# Patient Record
Sex: Female | Born: 1950
Health system: Southern US, Community
[De-identification: ages and names within clinical notes are randomized; demographics above are authoritative.]

## PROBLEM LIST (undated history)

## (undated) DIAGNOSIS — Q625 Duplication of ureter: Secondary | ICD-10-CM

## (undated) DIAGNOSIS — D126 Benign neoplasm of colon, unspecified: Secondary | ICD-10-CM

## (undated) DIAGNOSIS — C539 Malignant neoplasm of cervix uteri, unspecified: Secondary | ICD-10-CM

## (undated) DIAGNOSIS — R112 Nausea with vomiting, unspecified: Secondary | ICD-10-CM

## (undated) DIAGNOSIS — E785 Hyperlipidemia, unspecified: Secondary | ICD-10-CM

## (undated) DIAGNOSIS — N809 Endometriosis, unspecified: Secondary | ICD-10-CM

## (undated) DIAGNOSIS — M8580 Other specified disorders of bone density and structure, unspecified site: Secondary | ICD-10-CM

## (undated) DIAGNOSIS — Z9889 Other specified postprocedural states: Secondary | ICD-10-CM

## (undated) DIAGNOSIS — K7689 Other specified diseases of liver: Secondary | ICD-10-CM

## (undated) DIAGNOSIS — R32 Unspecified urinary incontinence: Secondary | ICD-10-CM

## (undated) DIAGNOSIS — J45909 Unspecified asthma, uncomplicated: Secondary | ICD-10-CM

## (undated) DIAGNOSIS — D3132 Benign neoplasm of left choroid: Secondary | ICD-10-CM

## (undated) HISTORY — DX: Unspecified urinary incontinence: R32

## (undated) HISTORY — DX: Endometriosis, unspecified: N80.9

## (undated) HISTORY — DX: Unspecified asthma, uncomplicated: J45.909

## (undated) HISTORY — DX: Duplication of ureter: Q62.5

## (undated) HISTORY — PX: POLYPECTOMY: SHX149

## (undated) HISTORY — DX: Malignant neoplasm of cervix uteri, unspecified: C53.9

## (undated) HISTORY — DX: Other specified diseases of liver: K76.89

## (undated) HISTORY — DX: Benign neoplasm of left choroid: D31.32

## (undated) HISTORY — DX: Other specified disorders of bone density and structure, unspecified site: M85.80

## (undated) HISTORY — DX: Hyperlipidemia, unspecified: E78.5

## (undated) HISTORY — DX: Nausea with vomiting, unspecified: R11.2

## (undated) HISTORY — DX: Benign neoplasm of colon, unspecified: D12.6

## (undated) HISTORY — DX: Other specified postprocedural states: Z98.890

## (undated) HISTORY — PX: OTHER SURGICAL HISTORY: SHX169

---

## 1999-03-26 ENCOUNTER — Encounter (INDEPENDENT_AMBULATORY_CARE_PROVIDER_SITE_OTHER): Payer: Self-pay | Admitting: Specialist

## 1999-03-26 ENCOUNTER — Ambulatory Visit (HOSPITAL_COMMUNITY): Admission: RE | Admit: 1999-03-26 | Discharge: 1999-03-26 | Payer: Self-pay | Admitting: Gynecology

## 1999-07-12 ENCOUNTER — Encounter: Payer: Self-pay | Admitting: Family Medicine

## 1999-07-12 ENCOUNTER — Encounter: Admission: RE | Admit: 1999-07-12 | Discharge: 1999-07-12 | Payer: Self-pay | Admitting: Family Medicine

## 2000-08-03 ENCOUNTER — Encounter: Payer: Self-pay | Admitting: Family Medicine

## 2000-08-03 ENCOUNTER — Encounter: Admission: RE | Admit: 2000-08-03 | Discharge: 2000-08-03 | Payer: Self-pay | Admitting: Family Medicine

## 2001-08-04 ENCOUNTER — Encounter: Payer: Self-pay | Admitting: Family Medicine

## 2001-08-04 ENCOUNTER — Encounter: Admission: RE | Admit: 2001-08-04 | Discharge: 2001-08-04 | Payer: Self-pay | Admitting: Family Medicine

## 2002-08-08 ENCOUNTER — Other Ambulatory Visit: Admission: RE | Admit: 2002-08-08 | Discharge: 2002-08-08 | Payer: Self-pay | Admitting: Obstetrics and Gynecology

## 2003-08-10 ENCOUNTER — Other Ambulatory Visit: Admission: RE | Admit: 2003-08-10 | Discharge: 2003-08-10 | Payer: Self-pay | Admitting: Obstetrics and Gynecology

## 2004-08-20 ENCOUNTER — Other Ambulatory Visit: Admission: RE | Admit: 2004-08-20 | Discharge: 2004-08-20 | Payer: Self-pay | Admitting: Family Medicine

## 2004-08-21 ENCOUNTER — Encounter: Admission: RE | Admit: 2004-08-21 | Discharge: 2004-08-21 | Payer: Self-pay | Admitting: Family Medicine

## 2005-08-25 ENCOUNTER — Encounter: Admission: RE | Admit: 2005-08-25 | Discharge: 2005-08-25 | Payer: Self-pay | Admitting: Obstetrics and Gynecology

## 2006-04-14 HISTORY — PX: OTHER SURGICAL HISTORY: SHX169

## 2006-07-27 ENCOUNTER — Encounter: Admission: RE | Admit: 2006-07-27 | Discharge: 2006-08-28 | Payer: Self-pay | Admitting: Nurse Practitioner

## 2006-08-27 ENCOUNTER — Encounter: Admission: RE | Admit: 2006-08-27 | Discharge: 2006-08-27 | Payer: Self-pay | Admitting: Obstetrics and Gynecology

## 2006-12-22 ENCOUNTER — Ambulatory Visit (HOSPITAL_COMMUNITY): Admission: RE | Admit: 2006-12-22 | Discharge: 2006-12-23 | Payer: Self-pay | Admitting: Obstetrics and Gynecology

## 2010-04-14 DIAGNOSIS — Z9889 Other specified postprocedural states: Secondary | ICD-10-CM

## 2010-04-14 HISTORY — DX: Other specified postprocedural states: Z98.890

## 2010-08-27 NOTE — H&P (Signed)
NAMEMEGIN, CONSALVO              ACCOUNT NO.:  [id number]   MEDICAL RECORD NO.:  [id number]          PATIENT TYPE:  AMB   LOCATION:  SDC                           FACILITY:  [facility]   PHYSICIAN:  Randye Lobo, M.D.   DATE OF BIRTH:  [birth year]   DATE OF ADMISSION:  12/22/2006  DATE OF DISCHARGE:                              HISTORY & PHYSICAL   DATE OF PREOPERATIVE HISTORY AND PHYSICAL EXAMINATION:  December 11, 2006.   CHIEF COMPLAINT:  Urinary incontinence.   HISTORY OF PRESENT ILLNESS:  The patient is a 60 year old, para 2,  Caucasian female with a history of urinary incontinence who first  presented for evaluation in May 2007.  The patient had previously been  seen by a urologist in [place] and did not pursue any surgical  treatment.  She has previously had urodynamic study performed in 2001.  The patient does report urinary leakage with stressful maneuvers such as  coughing, sneezing, and standing.  She also reports urgency-related  leakage.  The patient has been treated in the past with Detrol LA which  caused some blurred vision and diarrhea and was, therefore,  discontinued.  She has been also treated with Philis Nettle and is currently  not on any anticholinergic therapy.  [facility] has not improved her symptoms of urinary  incontinence, and the patient is interested in proceeding with surgical  treatment.   Multichannel urodynamic testing was performed in the office in August  2007.  The uroflow study demonstrated a continuous flow of urine with a  postvoid residual of 12 mL.  Cystometrics demonstrated genuine stress  incontinence with a Valsalva lead point pressure of 45 cm of water at  222 mL of urine which was at maximum capacity.  The patient was noted to  have early first desire at 55 mL.  The patient's cystometric study  demonstrated no evidence of detrusor instability.  Pressure flow study  showed a maximum  detrusor pressure of 35 cm of water.   The patient's past obstetric and gynecologic histories are remarkable  for two prior vaginal deliveries.  Each child weighed over 8 pounds.  The patient's last menstrual period was in 2004, and she is not on any  hormone replacement therapy.  Her last Pap smear was performed in May  2008 and was within normal limits.  Her last mammogram was performed in  May 2008 and was also within normal limits.  The patient is status post  D&C for a benign endometrial polyp in 2001.   PAST MEDICAL HISTORY:  1. Hyperlipidemia which is currently untreated.  On Sep 04, 2006, the      patient had a total cholesterol of 288 with LDL cholesterol of 185.      A fasting glucose was noted to be 94.  2. The patient has had cardiac palpitations which have been evaluated      by her cardiologist.  The patient underwent an echocardiogram in      June 2008, and I do not have the results of this.  3.  Recurrent urinary tract infections and hematuria.  The patient was      seen by [facility] by Dr. Bjorn Pippin.  An abdominal      ultrasound performed on November 21, 2005, documented normal kidneys      with no evidence of hydronephrosis, nephrolithiasis, or renal mass.      Of note, the patient was found to have liver cysts of the right      lobe of the liver which measured 3.62 cm, 3.1 cm, and the largest      measuring 13.05 cm.  Consultation with a gastroenterologist by Dr.      Annabell Howells gave the opinion that these were benign in nature and needed      no further workup.   PAST SURGICAL HISTORY:  Status post D&C in 2001.   MEDICATIONS:  Flonase, Claritin p.r.n., multivitamins, fish oil,  calcium.   ALLERGIES:  DETROL produces blurred vision and diarrhea.   SOCIAL HISTORY:  The patient is married.  She is a Solicitor.  She  denies the use of tobacco.  She drinks 1-2 alcohol beverages per day.  She denies the use of illicit drugs.   FAMILY HISTORY:  Positive for  hypertension in the patient's mother and  positive for stroke in the patient's maternal grandmother.  Her family  medical history is negative for breast, ovarian, uterine, or colon  cancer.   REVIEW OF SYSTEMS:  Please refer to the History of Present Illness.   PHYSICAL EXAMINATION:  VITAL SIGNS:  Height 5 feet 2 inches, weight 142  pounds.  Blood pressure 117/74.  GENERAL:  The patient is a middle-age-appearing Caucasian female in no  acute distress.  HEENT:  Normocephalic and atraumatic.  HEART:  S1, S2 with a regular rate and rhythm.  There is no evidence of  a murmur, rub, or gallop.  There is no evidence of any lower extremity  edema.  LUNGS:  Clear to auscultation bilaterally.  ABDOMEN:  Soft and nontender and without evidence of hepatosplenomegaly  or organomegaly.  PELVIC:  Normal external genitalia and urethra.  There is a minimal  cystocele appreciated.  The cervix is noted to be without lesions.  The  uterus is small and nontender.  There is no evidence of adnexal masses  nor tenderness.   IMPRESSION:  The patient is a 60 year old, para 2 female with  urodynamically-proven genuine stress incontinence.   PLAN:  The patient will undergo a tension-free vaginal tape suburethral  sling and cystoscopy at the [facility] on December 22, 2006.  Risks, benefits, and alternatives have been discussed with the  patient who wishes to proceed.      Randye Lobo, M.D.  Electronically Signed     BES/MEDQ  D:  12/21/2006  T:  12/21/2006  Job:  [id number]

## 2010-08-27 NOTE — Op Note (Signed)
Amber Escobar, CHEEK              ACCOUNT NO.:  [id number]   MEDICAL RECORD NO.:  [id number]          PATIENT TYPE:  INP   LOCATION:  9311                          FACILITY:  [facility]   PHYSICIAN:  Randye Lobo, M.D.   DATE OF BIRTH:  25-Dec-1950   DATE OF PROCEDURE:  12/22/2006  DATE OF DISCHARGE:                               OPERATIVE REPORT   PREOPERATIVE DIAGNOSIS:  Genuine stress incontinence.   POSTOPERATIVE DIAGNOSES:  1. Genuine stress incontinence.  2. Duplication of right ureter.   PROCEDURE:  Tension-free vaginal tape suburethral sling and cystoscopy.   SURGEON:  Conley Simmonds, MD   ASSISTANT:  Lodema Hong, MD   ANESTHESIA:  General endotracheal, local with 0.5% lidocaine with  1:100,000 of epinephrine.   IV FLUIDS:  2000 mL Ringer's lactate.   ESTIMATED BLOOD LOSS:  350 mL.   URINE OUTPUT:  250 mL   COMPLICATIONS:  None.   INDICATIONS FOR PROCEDURE:  The patient is a 60 year old para 2  Caucasian female who presented with urinary incontinence with stressful  maneuvers and also with urgency related urinary incontinence.  The  patient has been treated with anticholinergic therapy and also pelvic  floor rehabilitation and she continues to have urinary incontinence  which has become a social problem for her.  The patient has had some  recurrent urinary tract infections and has seen Dr. Bjorn Pippin at  [facility], who has performed an abdominal ultrasound indicating  normal kidneys, with  no evidence of nephrolithiasis.  As the patient's  symptoms of urinary tract infections have resolved, she has not had a  cystoscopy to my knowledge.   The patient did have multichannel urodynamic testing performed in the  office and she was noted to have a Valsalva leak point pressure of 45 cm  of water.   A plan is made now to proceed with a tension-free vaginal tape and a  cystoscopy after risks, benefits, and alternatives are reviewed.   FINDINGS:  The patient was  noted to have a very minimal cystocele.  She  had good uterine support and good posterior vaginal wall support.   Cystoscopy demonstrated duplication of the right ureter and the presence  of a single left ureter.  All ureters were noted to be patent after the  injection of indigo carmine dye IV.  There was no evidence of a foreign  body in either the bladder or the urethra after the bladder was  visualized throughout 360 degrees.  The patient had a normal bladder  dome and trigone.   SPECIMENS:  None.   PROCEDURE:  The patient was reidentified in the preoperative hold area.  She did receive ciprofloxacin 400 mg IV for preoperative antibiotic  prophylaxis.  The patient received both TED hose and PAS stockings for  DVT prophylaxis.   In the operating room, general endotracheal anesthesia was induced and  the patient was placed in the dorsal lithotomy position.  The suprapubic  region and vagina and perineum were then sterilely prepped and draped,  and a Foley catheter was placed to gravity drainage.   An exam under  anesthesia was performed and the findings are as noted  above.   A weighted speculum was placed inside the vagina and Allis clamps were  placed along the anterior vaginal wall over a distance of 3 cm.  The  vaginal mucosa was injected with 0.5% lidocaine with 1:200,000 of  epinephrine.  A vertical midline incision was created in the anterior  vaginal mucosa from 1 cm below the urethra to the bottom of the Allis  clamps.  Sharp dissection was then used to dissect the vaginal mucosa  off of the underlying bladder bilaterally.   The suprapubic incisions were then created 2.5 cm to the right and the  left of midline.  The TVT was performed in a top-down fashion.  The  abdominal needle passer was placed through the right suprapubic incision  and out through the endopelvic fascia on the ipsilateral side and  lateral to the mid urethra.  The same procedure that was performed  on  right-hand side was then repeated on the left-hand side.   The Foley catheter was removed and cystoscopy was performed at this time  and the findings are as noted above.   The cystoscope was removed and a Foley catheter was left to gravity  drainage.  The sling was then attached to the abdominal needle passers  and was drawn up through the suprapubic incisions bilaterally.  There  was some bleeding noted coming from the exit site of the sling on the  patient's right-hand side along the vaginal arm of the sling.  To  improve access and visualization of this region, the decision was made  to go ahead and finished placing the sling before addressing the  bleeding vessel.  A Kelly clamp was placed between the sling and the  urethra and the plastic sheaths were removed.  The sling was noted to be  in good position.  Excess sling material was trimmed suprapubically.   At this time the bleeding vessel along the exit site of the sling on the  right-hand side was addressed.  The vaginal incision was extended toward  the cervix and the bladder was dissected off of the mucosa to improve  visualization in this region.  Two figure-of-eight sutures of 2-0 Vicryl  were placed in this region which helped to control the bleeding.  Compression was then used in this region which functioned well to make  the hemostasis acceptable.  Gelfoam was then placed in this region to  provide compression then further hemostasis.   Excess vaginal mucosa was then trimmed bilaterally.  As the hemostasis  was good, the anterior vaginal mucosa was then closed with figure-of-  eight sutures of 2-0 Vicryl.  A plain gauze packing was placed in the  vagina.   The suprapubic incisions were closed with Dermabond.   This concluded the patient's procedure.  There were no complications.  All needle, instrument, sponge counts were correct.      Randye Lobo, M.D.  Electronically Signed     BES/MEDQ  D:   12/22/2006  T:  12/22/2006  Job:  [id number]   cc:   Excell Seltzer. Annabell Howells, M.D.  Fax: [phone]

## 2010-08-30 NOTE — Op Note (Signed)
[facility]  Patient:    Amber Escobar                      MRN: [id number] Proc. Date: 03/26/99 Adm. Date:  [id number] Attending:  Merrily Pew                           Operative Report  PREOPERATIVE DIAGNOSIS:       Endometrial polyp.  POSTOPERATIVE DIAGNOSIS:      Endometrial polyp.  PROCEDURE:                    Hysteroscopy, endometrial polypectomy, D&C.  SURGEON:                      Timothy P. Fontaine, M.D.  ANESTHESIA:                   IV sedation with 1% lidocaine paracervical block.  COMPLICATIONS:                None.  ESTIMATED BLOOD LOSS:         Minimal.  Sorbitol discrepancy less than 50 cc.  FINDINGS:                     Large endometrial polyp resected, intact.  Post polypectomy/hysteroscopy was normal noting normal fundus anterior posterior uterine services lower uterine segment and endocervical canal.  Right and left tubal regions visualized although ostia not clearly seen on either side.  PROCEDURE:                    The patient was taken to the operating room and underwent IV sedation and was placed in low dorsal lithotomy position. Received a perineal vaginal preparation with Betadine scrub and Betadine solution . The bladder was emptied with in and out Foley catheterization and EUA was performed and the patient was draped in the usual fashion. The cervix was visualized, grasped  with a single- toothed tenaculum and a 1% lidocaine paracervical block was placed approximately 4-5 cc per side.  The cervix was gently dilated to admit the diagnostic hysteroscope and initial visualization was difficult switching to the operative resectoscope and subsequent visualization was clear.                                A classic endometrial polyp emanating from the posterior upper left fundal region of the endometrial surface was encountered and this was resected at the base intact with the right angle  resectascopic loop. Hysteroscopy as described above was otherwise normal.  A gentle sharp curettage was then performed without difficulty and re-hysteroscopy showed the cavity to be normal with good distention, no evidence of perforation.  The instruments were hen removed.  Adequate hemostasis visualized.  The patient placed in the supine position and taken to the recovery room in good condition having tolerated the procedure well.  SPECIMENS:                    1. Endometrial polyp.                               2. Questionable endometrial polyp.  3. Endometrial curetting.                                Sorbitol discrepancy was less than 50 cc noting 50 cc recorded on the machine and a fair amount of sorbitol on the floor. DD:  03/26/99 TD:  03/26/99 Job: [id number]

## 2011-01-24 LAB — CBC
HCT: 30.3 — ABNORMAL LOW
HCT: 39.8
MCV: 90.3
Platelets: 206
Platelets: 252
RDW: 13.4
RDW: 13.5
WBC: 5
WBC: 9.3

## 2011-01-24 LAB — BASIC METABOLIC PANEL
BUN: 13
BUN: 3 — ABNORMAL LOW
Calcium: 8.5
Chloride: 104
Creatinine, Ser: 0.56
Creatinine, Ser: 0.57
GFR calc non Af Amer: >60
GFR calc non Af Amer: >60
Glucose, Bld: 125 — ABNORMAL HIGH
Glucose, Bld: 86
Potassium: 3.5

## 2011-01-24 LAB — URINALYSIS, ROUTINE W REFLEX MICROSCOPIC
Bilirubin Urine: NEGATIVE
Ketones, ur: NEGATIVE
Nitrite: NEGATIVE
Protein, ur: NEGATIVE
Urobilinogen, UA: 0.2

## 2011-12-14 HISTORY — PX: VARICOSE VEIN SURGERY: SHX832

## 2012-12-09 ENCOUNTER — Ambulatory Visit (INDEPENDENT_AMBULATORY_CARE_PROVIDER_SITE_OTHER): Payer: BC Managed Care – PPO | Admitting: Obstetrics and Gynecology

## 2012-12-09 ENCOUNTER — Encounter: Payer: Self-pay | Admitting: Obstetrics and Gynecology

## 2012-12-09 VITALS — BP 132/80 | HR 70 | Ht 61.0 in | Wt 136.5 lb

## 2012-12-09 DIAGNOSIS — Z0000 Encounter for general adult medical examination without abnormal findings: Secondary | ICD-10-CM

## 2012-12-09 DIAGNOSIS — Z01419 Encounter for gynecological examination (general) (routine) without abnormal findings: Secondary | ICD-10-CM

## 2012-12-09 DIAGNOSIS — N760 Acute vaginitis: Secondary | ICD-10-CM

## 2012-12-09 DIAGNOSIS — N762 Acute vulvitis: Secondary | ICD-10-CM

## 2012-12-09 LAB — POCT URINALYSIS DIPSTICK
Blood, UA: NEGATIVE
Glucose, UA: NEGATIVE
Leukocytes, UA: NEGATIVE
Nitrite, UA: NEGATIVE
Urobilinogen, UA: NEGATIVE

## 2012-12-09 MED ORDER — NYSTATIN-TRIAMCINOLONE 100000-0.1 UNIT/GM-% EX CREA: TOPICAL_CREAM | Freq: Two times a day (BID) | CUTANEOUS | Status: DC

## 2012-12-09 NOTE — Patient Instructions (Signed)

## 2012-12-09 NOTE — Progress Notes (Signed)
Patient ID: Amber Escobar, female   DOB: 1950/07/13, 62 y.o.   MRN: [id number] GYNECOLOGY VISIT  PCP:  Dr. Marinda Elk  Referring provider:   HPI: 62 y.o.   Married  Caucasian  female   G2P2002 with Patient's last menstrual period was 04/14/2002.   here for  AEX  Vaginal itching for the last 2 weeks.   Using over the counter antifungal cream - homeopathic.  No discharge.  Having burning.  No odor.   Doing water aerobics.  Stopped probiotics recently.  No bladder leakage.  No urgency or frequency.    Labs today:    Hgb:  PCP Urine:  Neg  GYNECOLOGIC HISTORY: Patient's last menstrual period was 04/14/2002. Menses:  post-menopausal Sexually active:  yes Partner preference: female Contraception:  post menopausal status Hormone therapy:  DES exposure:  denies Blood transfusions:  none Sexually transmitted diseases:  The patient denies history of sexually transmitted disease. Previous GYN Procedures:  TVT cysto Last mammogram:  normal Date:     11/2011             [facility]  Last pap:  normal Date: 12-09-11 with Negative HR HPV History of abnormal pap smear:  no   OB History   Grav Para Term Preterm Abortions TAB SAB Ect Mult Living   2 2 2       2        LIFESTYLE: Exercise:   Cardio/strength, water aerobics, aqua zumba and walking            Tobacco:    no Alcohol:       7-10 glasses of wine per week Drug use:    no  OTHER HEALTH MAINTENANCE: Last tetanus/TDap:  8 years ago Gardisil:  NA Last Influenza:  12/2011 Zostavax:  12/2011  Last bone density:  never Last colonoscopy:  2013 with [facility] [id number] colonoscopy 2017  Last cholesterol check: 06/2010 slightly elevated but doesn't take medication.  Dr. Foy Guadalajara managing.   Family History  Problem Relation Age of Onset  . Hypertension Mother   . Rheum arthritis Father   . Congestive Heart Failure Father     There are no active problems to display for this patient.   Past Medical History   Diagnosis Date  . Endometriosis   . Urinary incontinence   . Hyperlipidemia     Past Surgical History  Procedure Laterality Date  . Transvaginal tape (tvt) removal  2008    double right utereter  . Varicose vein surgery Left 12/2011    left left    ALLERGIES: Review of patient's allergies indicates no known allergies.  Current Outpatient Prescriptions  Medication Sig Dispense Refill  . clobetasol ointment (TEMOVATE) 0.05 % Apply 1 application topically as needed.       No current facility-administered medications for this visit.     ROS:  Pertinent items are noted in HPI.  SOCIAL HISTORY:  Married.  Retired 2 years ago.  Caring for elderly parents and inlaws.   PHYSICAL EXAMINATION:    BP 132/80  Pulse 70  Ht 5\' 1"  (1.549 m)  Wt 136 lb 8 oz (61.916 kg)  BMI 25.8 kg/m2  LMP 04/14/2002   Wt Readings from Last 3 Encounters:  12/09/12 136 lb 8 oz (61.916 kg)     Ht Readings from Last 3 Encounters:  12/09/12 5\' 1"  (1.549 m)    General appearance: alert, cooperative and appears stated age Head: Normocephalic, without obvious abnormality, atraumatic Neck:  no adenopathy, supple, symmetrical, trachea midline and thyroid not enlarged, symmetric, no tenderness/mass/nodules Lungs: clear to auscultation bilaterally Breasts: Inspection negative, No nipple retraction or dimpling, No nipple discharge or bleeding, No axillary or supraclavicular adenopathy, Normal to palpation without dominant masses Heart: regular rate and rhythm Abdomen: soft, non-tender; no masses,  no organomegaly Extremities: extremities normal, atraumatic, no cyanosis or edema Skin: Skin color, texture, turgor normal. No rashes or lesions Lymph nodes: Cervical, supraclavicular, and axillary nodes normal. No abnormal inguinal nodes palpated Neurologic: Grossly normal   Pelvic: External genitalia:  no lesions.  Erythema noted.              Urethra:  normal appearing urethra with no masses, tenderness or  lesions              Bartholins and Skenes: normal                 Vagina: normal appearing vagina with normal color and discharge, no lesions              Cervix: normal appearance              Pap taken: no.  High risk HPV testing no.        Bimanual Exam:  Uterus:  uterus is normal size, shape, consistency and nontender                                      Adnexa: normal adnexa in size, nontender and no masses                                      Rectovaginal: Confirms                                      Anus:  normal sphincter tone, no lesions  Wet prep - ph 5.5, negative yeast, clue cells, and trichomonas.  ASSESSMENT Normal gynecologic exam. Vulvitis  PLAN Mycolog II [facility].  Patient will call to make appointment. Pap smear and high risk HPV testing in 2018. Medicatons per Epic orders.  Mycolog II. Return annually or prn   An After Visit Summary was printed and given to the patient.

## 2012-12-15 ENCOUNTER — Ambulatory Visit
Admission: RE | Admit: 2012-12-15 | Discharge: 2012-12-15 | Disposition: A | Discharge status: Home / Self Care | Payer: BC Managed Care – PPO | Source: Ambulatory Visit | Attending: Obstetrics and Gynecology | Admitting: Obstetrics and Gynecology

## 2012-12-15 DIAGNOSIS — Z01419 Encounter for gynecological examination (general) (routine) without abnormal findings: Secondary | ICD-10-CM

## 2013-02-17 ENCOUNTER — Other Ambulatory Visit: Payer: Self-pay

## 2013-05-12 ENCOUNTER — Encounter: Payer: Self-pay | Admitting: Obstetrics and Gynecology

## 2013-12-07 ENCOUNTER — Encounter: Payer: Self-pay | Admitting: Obstetrics and Gynecology

## 2013-12-12 ENCOUNTER — Ambulatory Visit: Payer: BC Managed Care – PPO | Admitting: Obstetrics and Gynecology

## 2014-01-11 ENCOUNTER — Telehealth: Payer: Self-pay | Admitting: Obstetrics and Gynecology

## 2014-01-11 NOTE — Telephone Encounter (Signed)
Moving pt appt up from cancellation list

## 2014-01-12 ENCOUNTER — Ambulatory Visit: Payer: BC Managed Care – PPO | Admitting: Obstetrics and Gynecology

## 2014-02-01 ENCOUNTER — Encounter: Payer: Self-pay | Admitting: Obstetrics and Gynecology

## 2014-02-01 ENCOUNTER — Ambulatory Visit (INDEPENDENT_AMBULATORY_CARE_PROVIDER_SITE_OTHER): Payer: BC Managed Care – PPO | Admitting: Obstetrics and Gynecology

## 2014-02-01 VITALS — BP 130/76 | HR 72 | Resp 18 | Ht 61.5 in | Wt 132.0 lb

## 2014-02-01 DIAGNOSIS — Z0000 Encounter for general adult medical examination without abnormal findings: Secondary | ICD-10-CM

## 2014-02-01 DIAGNOSIS — Z01419 Encounter for gynecological examination (general) (routine) without abnormal findings: Secondary | ICD-10-CM

## 2014-02-01 LAB — POCT URINALYSIS DIPSTICK
BILIRUBIN UA: NEGATIVE
GLUCOSE UA: NEGATIVE
KETONES UA: NEGATIVE
LEUKOCYTES UA: NEGATIVE
Nitrite, UA: NEGATIVE
PH UA: 7
Protein, UA: NEGATIVE
RBC UA: NEGATIVE
Urobilinogen, UA: NEGATIVE

## 2014-02-01 NOTE — Patient Instructions (Signed)

## 2014-02-01 NOTE — Progress Notes (Signed)
63 y.o. [id number] MarriedCaucasianF here for annual exam.    Bladder working well.   Some lightheadedness and feeling "woozy." Will do labs with PCP, Dr. Mont Dutton.  Mother age 46 passed 4 weeks ago from bowel obstruction.  Also had COPD and atrial fibrillation.   Patient's last menstrual period was 04/14/2002.          Sexually active: No.  The current method of family planning is post menopausal status.    Exercising: Yes.    water areobics, step/ strength training, walking 4 - 5 x weekly Smoker:  no  Health Maintenance: Pap: 11/2011 neg. HR HPV neg. - per pt History of abnormal Pap:  no MMG: 12/2012 BIRADS1: neg Colonoscopy: 2011 every 5 years  BMD:   never TDaP: 2006 Flu vaccine:  September 2015. Shingles vaccine:  2013. Screening Labs: PCP, Hb today: PCP, Urine today: negative   reports that she has never smoked. She has never used smokeless tobacco. She reports that she drinks about 6 - 8.4 ounces of alcohol per week. She reports that she does not use illicit drugs.  Past Medical History  Diagnosis Date  . Endometriosis   . Hyperlipidemia   . Urinary incontinence   . 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 Varicose vein surgery Left 12/2011    left left  . Midurethral sling  2008    double right ureter    Current Outpatient Prescriptions  Medication Sig Dispense Refill  . clobetasol ointment (TEMOVATE) 7.10 % Apply 1 application topically as needed.      . fluticasone (FLONASE) 50 MCG/ACT nasal spray        No current facility-administered medications for this visit.    Family History  Problem Relation Age of Onset  . Hypertension Mother   . Rheum arthritis Father   . Congestive Heart Failure Father     ROS:  Pertinent items are noted in HPI.  Otherwise, a comprehensive ROS was negative.  Exam:   BP 130/76  Pulse 72  Resp 18  Ht 5' 1.5" (1.562 m)  Wt 132 lb (59.875 kg)  BMI 24.54 kg/m2  LMP 04/14/2002  Weight  change: @WEIGHTCHANGE @ Height:   Height: 5' 1.5" (156.2 cm)  Ht Readings from Last 3 Encounters:  02/01/14 5' 1.5" (1.562 m)  12/09/12 5\' 1"  (1.549 m)    General appearance: alert, cooperative and appears stated age.  Tearful during visit.  Head: Normocephalic, without obvious abnormality, atraumatic Neck: no adenopathy, supple, symmetrical, trachea midline and thyroid normal to inspection and palpation Lungs: clear to auscultation bilaterally Breasts: normal appearance, no masses or tenderness, Inspection negative, No nipple retraction or dimpling, No nipple discharge or bleeding, No axillary or supraclavicular adenopathy Heart: regular rate and rhythm Abdomen: soft, non-tender; bowel sounds normal; no masses,  no organomegaly Extremities: extremities normal, atraumatic, no cyanosis or edema Skin: Skin color, texture, turgor normal. No rashes or lesions Lymph nodes: Cervical, supraclavicular, and axillary nodes normal. No abnormal inguinal nodes palpated Neurologic: Grossly normal   Pelvic: External genitalia:  no lesions              Urethra:  normal appearing urethra with no masses, tenderness or lesions              Bartholins and Skenes: normal                 Vagina: atrophy noted, no lesions  Cervix: anteverted              Pap taken: No. Bimanual Exam:  Uterus:  normal size, contour, position, consistency, mobility, non-tender              Adnexa: normal adnexa and no mass, fullness, tenderness               Rectovaginal: Confirms               Anus:  normal sphincter tone, no lesions  A:  Well Woman with normal exam Vaginal atrophy. History of midurethral sling.  Bereavement.   P:   Mammogram yearly.   pap smear and HR HPV not indicated.  Discussed options for vaginal estrogen if needed.  Labs with PCP.  Support given.  return annually or prn  An After Visit Summary was printed and given to the patient.

## 2014-02-03 ENCOUNTER — Other Ambulatory Visit: Payer: Self-pay

## 2014-02-03 DIAGNOSIS — Z1231 Encounter for screening mammogram for malignant neoplasm of breast: Secondary | ICD-10-CM

## 2014-02-13 ENCOUNTER — Encounter: Payer: Self-pay | Admitting: Obstetrics and Gynecology

## 2014-02-15 ENCOUNTER — Ambulatory Visit
Admission: RE | Admit: 2014-02-15 | Discharge: 2014-02-15 | Disposition: A | Discharge status: Home / Self Care | Payer: BC Managed Care – PPO | Source: Ambulatory Visit

## 2014-02-15 DIAGNOSIS — Z1231 Encounter for screening mammogram for malignant neoplasm of breast: Secondary | ICD-10-CM

## 2014-05-03 ENCOUNTER — Ambulatory Visit: Payer: BC Managed Care – PPO | Admitting: Obstetrics and Gynecology

## 2014-05-15 ENCOUNTER — Other Ambulatory Visit (HOSPITAL_BASED_OUTPATIENT_CLINIC_OR_DEPARTMENT_OTHER): Payer: Self-pay | Admitting: Family Medicine

## 2014-05-15 ENCOUNTER — Other Ambulatory Visit (HOSPITAL_BASED_OUTPATIENT_CLINIC_OR_DEPARTMENT_OTHER): Payer: Self-pay

## 2014-05-15 DIAGNOSIS — R1031 Right lower quadrant pain: Secondary | ICD-10-CM

## 2014-05-16 ENCOUNTER — Ambulatory Visit (HOSPITAL_BASED_OUTPATIENT_CLINIC_OR_DEPARTMENT_OTHER)
Admission: RE | Admit: 2014-05-16 | Discharge: 2014-05-16 | Disposition: A | Discharge status: Home / Self Care | Payer: BLUE CROSS/BLUE SHIELD | Source: Ambulatory Visit | Attending: Family Medicine | Admitting: Family Medicine

## 2014-05-16 DIAGNOSIS — R938 Abnormal findings on diagnostic imaging of other specified body structures: Secondary | ICD-10-CM | POA: Insufficient documentation

## 2014-05-16 DIAGNOSIS — R1031 Right lower quadrant pain: Secondary | ICD-10-CM | POA: Diagnosis not present

## 2014-05-16 DIAGNOSIS — N854 Malposition of uterus: Secondary | ICD-10-CM | POA: Diagnosis not present

## 2014-05-16 MED ORDER — IOHEXOL 300 MG/ML  SOLN
100.0000 mL | Freq: Once | INTRAMUSCULAR | Status: AC | PRN
  Administered 2014-05-16: 100 mL via INTRAVENOUS

## 2014-07-10 DIAGNOSIS — H2513 Age-related nuclear cataract, bilateral: Secondary | ICD-10-CM | POA: Insufficient documentation

## 2014-07-10 DIAGNOSIS — D3132 Benign neoplasm of left choroid: Secondary | ICD-10-CM | POA: Insufficient documentation

## 2015-01-17 ENCOUNTER — Other Ambulatory Visit: Payer: Self-pay

## 2015-01-17 DIAGNOSIS — Z1231 Encounter for screening mammogram for malignant neoplasm of breast: Secondary | ICD-10-CM

## 2015-02-07 ENCOUNTER — Ambulatory Visit: Payer: Self-pay | Admitting: Obstetrics and Gynecology

## 2015-02-19 ENCOUNTER — Ambulatory Visit: Payer: BLUE CROSS/BLUE SHIELD

## 2015-02-23 ENCOUNTER — Ambulatory Visit (INDEPENDENT_AMBULATORY_CARE_PROVIDER_SITE_OTHER): Payer: BLUE CROSS/BLUE SHIELD | Admitting: Obstetrics and Gynecology

## 2015-02-23 ENCOUNTER — Encounter: Payer: Self-pay | Admitting: Obstetrics and Gynecology

## 2015-02-23 VITALS — BP 122/84 | HR 70 | Resp 18 | Ht 61.0 in | Wt 133.0 lb

## 2015-02-23 DIAGNOSIS — Z01419 Encounter for gynecological examination (general) (routine) without abnormal findings: Secondary | ICD-10-CM | POA: Diagnosis not present

## 2015-02-23 NOTE — Progress Notes (Addendum)
64 y.o. G14P2002 Married Caucasian female here for annual exam.    Having nausea.  Uncertain what is the cause.  Turning her head is causing this.  Had fluid in her ear.  Took course of Amoxicillin from PCP.   Labs with PCP - LDLc cholesterol 174.  Not treating with Rx.   CT scan in Feb 2016 for right sided pain - showed probable 2.c cm fibroid on posterior uterus. Some DDD of spine.  Denies vaginal bleeding or pain.   PCP: Betty Martinique    Patient's last menstrual period was 04/14/2002.          Sexually active: No.  The current method of family planning is post menopausal status.    Exercising: Yes.    Walk, aerobics, strength training, gardening Smoker:  no  Health Maintenance: Pap:  11/2011 Neg. HR HPV:neg History of abnormal Pap:  no MMG:  02/16/14 BIRADS1:neg. Has appt 03/13/15 Colonoscopy:  01/2010 polyps - has appt 03/01/15  BMD:   never  TDaP:  UTD w/ PCP Screening Labs:  Hb today: PCP, Urine today: PCP   reports that she has never smoked. She has never used smokeless tobacco. She reports that she drinks about 6.0 - 8.4 oz of alcohol per week. She reports that she does not use illicit drugs.  Past Medical History  Diagnosis Date  . Endometriosis   . Hyperlipidemia   . Urinary incontinence   . 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 Varicose vein surgery Left 12/2011    left left  . Midurethral sling  2008    double right ureter    Current Outpatient Prescriptions  Medication Sig Dispense Refill  . aspirin 81 MG tablet Take 81 mg by mouth daily.    . Calcium-Phosphorus-Vitamin D (CITRACAL +D3 PO) Take by mouth daily.    . Ergocalciferol (VITAMIN D2) 2000 UNITS TABS Take by mouth daily.    . fluticasone (FLONASE) 50 MCG/ACT nasal spray     . Multiple Vitamin (MULTIVITAMIN) tablet Take 1 tablet by mouth daily.    . NON FORMULARY Phytoserol Complex 937mg  2x daily Beta Sistosterol 375mg  2x daily To help with Cholesterol     . Omega-3 Fatty Acids (FISH OIL) 1200 MG CAPS Take by mouth daily.    . Probiotic CAPS Take by mouth daily.    . clobetasol ointment (TEMOVATE) AB-123456789 % Apply 1 application topically as needed.    . [employer] GENTLE LAXATIVE 5 MG EC tablet See admin instructions.  0  . ondansetron (ZOFRAN) 4 MG tablet TAKE 1 TABLET THREE TIMES A DAY AS NEEDED ORALLY 7 DAYS  0  . polyethylene glycol-electrolytes (NULYTELY/GOLYTELY) 420 G solution See admin instructions.  0   No current facility-administered medications for this visit.    Family History  Problem Relation Age of Onset  . Hypertension Mother   . Rheum arthritis Father   . Congestive Heart Failure Father     ROS:  Pertinent items are noted in HPI.  Otherwise, a comprehensive ROS was negative.  Exam:   BP 122/84 mmHg  Pulse 70  Resp 18  Ht 5\' 1"  (1.549 m)  Wt 133 lb (60.328 kg)  BMI 25.14 kg/m2  LMP 04/14/2002    General appearance: alert, cooperative and appears stated age Head: Normocephalic, without obvious abnormality, atraumatic Neck: no adenopathy, supple, symmetrical, trachea midline and thyroid normal to inspection and palpation Lungs: clear to auscultation bilaterally Breasts: normal appearance,  no masses or tenderness, Inspection negative, No nipple retraction or dimpling, No nipple discharge or bleeding, No axillary or supraclavicular adenopathy Heart: regular rate and rhythm Abdomen: soft, non-tender; bowel sounds normal; no masses,  no organomegaly Extremities: extremities normal, atraumatic, no cyanosis or edema Skin: Skin color, texture, turgor normal. No rashes or lesions Lymph nodes: Cervical, supraclavicular, and axillary nodes normal. No abnormal inguinal nodes palpated Neurologic: Grossly normal  Pelvic: External genitalia:  no lesions              Urethra:  normal appearing urethra with no masses, tenderness or lesions              Bartholins and Skenes: normal                 Vagina: normal appearing vagina with  normal color and discharge, no lesions              Cervix: no lesions              Pap taken: Yes.   Bimanual Exam:  Uterus:  normal size, contour, position, consistency, mobility, non-tender              Adnexa: normal adnexa and no mass, fullness, tenderness              Rectovaginal: Yes.  .  Confirms.              Anus:  normal sphincter tone, no lesions  Chaperone was present for exam.  Assessment:   Well woman visit with normal exam. Probable small fibroid on CT scan.  Hx of midurethral sling.   Plan: Yearly mammogram recommended after age 3.  Recommended self breast exam.  Pap and HR HPV as above. Discussed Calcium and Vitamin D through regular diet.  Doing regular exercise program including cardiovascular and weight bearing exercise. Labs performed.  No..    Refills given on medications.  No..    Will hold off on ultrasound at this time.   Follow up annually and prn.       After visit summary provided.

## 2015-02-23 NOTE — Patient Instructions (Signed)

## 2015-02-28 LAB — IPS PAP TEST WITH HPV

## 2015-03-01 HISTORY — PX: COLONOSCOPY: SHX174

## 2015-03-13 ENCOUNTER — Ambulatory Visit
Admission: RE | Admit: 2015-03-13 | Discharge: 2015-03-13 | Disposition: A | Discharge status: Home / Self Care | Payer: BLUE CROSS/BLUE SHIELD | Source: Ambulatory Visit

## 2015-03-13 DIAGNOSIS — Z1231 Encounter for screening mammogram for malignant neoplasm of breast: Secondary | ICD-10-CM

## 2015-03-21 ENCOUNTER — Ambulatory Visit: Payer: Self-pay | Admitting: Obstetrics and Gynecology

## 2015-04-15 DIAGNOSIS — M8580 Other specified disorders of bone density and structure, unspecified site: Secondary | ICD-10-CM

## 2015-04-15 HISTORY — DX: Other specified disorders of bone density and structure, unspecified site: M85.80

## 2015-04-16 ENCOUNTER — Encounter (HOSPITAL_BASED_OUTPATIENT_CLINIC_OR_DEPARTMENT_OTHER): Payer: Self-pay | Admitting: *Deleted

## 2015-04-16 ENCOUNTER — Emergency Department (HOSPITAL_BASED_OUTPATIENT_CLINIC_OR_DEPARTMENT_OTHER)
Admission: EM | Admit: 2015-04-16 | Discharge: 2015-04-16 | Disposition: A | Discharge status: Home / Self Care | Payer: BLUE CROSS/BLUE SHIELD | Attending: Emergency Medicine | Admitting: Emergency Medicine

## 2015-04-16 ENCOUNTER — Emergency Department (HOSPITAL_BASED_OUTPATIENT_CLINIC_OR_DEPARTMENT_OTHER): Payer: BLUE CROSS/BLUE SHIELD

## 2015-04-16 DIAGNOSIS — Z79899 Other long term (current) drug therapy: Secondary | ICD-10-CM | POA: Insufficient documentation

## 2015-04-16 DIAGNOSIS — R200 Anesthesia of skin: Secondary | ICD-10-CM | POA: Diagnosis not present

## 2015-04-16 DIAGNOSIS — R110 Nausea: Secondary | ICD-10-CM | POA: Diagnosis not present

## 2015-04-16 DIAGNOSIS — Z8742 Personal history of other diseases of the female genital tract: Secondary | ICD-10-CM | POA: Diagnosis not present

## 2015-04-16 DIAGNOSIS — M546 Pain in thoracic spine: Secondary | ICD-10-CM | POA: Insufficient documentation

## 2015-04-16 DIAGNOSIS — R197 Diarrhea, unspecified: Secondary | ICD-10-CM | POA: Diagnosis not present

## 2015-04-16 DIAGNOSIS — Q625 Duplication of ureter: Secondary | ICD-10-CM | POA: Insufficient documentation

## 2015-04-16 DIAGNOSIS — R002 Palpitations: Secondary | ICD-10-CM | POA: Insufficient documentation

## 2015-04-16 DIAGNOSIS — M549 Dorsalgia, unspecified: Secondary | ICD-10-CM

## 2015-04-16 DIAGNOSIS — M545 Low back pain: Secondary | ICD-10-CM | POA: Insufficient documentation

## 2015-04-16 DIAGNOSIS — E785 Hyperlipidemia, unspecified: Secondary | ICD-10-CM | POA: Diagnosis not present

## 2015-04-16 DIAGNOSIS — Z7982 Long term (current) use of aspirin: Secondary | ICD-10-CM | POA: Diagnosis not present

## 2015-04-16 DIAGNOSIS — M791 Myalgia: Secondary | ICD-10-CM | POA: Diagnosis not present

## 2015-04-16 LAB — CBC WITH DIFFERENTIAL/PLATELET
BASOS PCT: 0 %
Basophils Absolute: 0 10*3/uL (ref 0.0–0.1)
EOS ABS: 0 10*3/uL (ref 0.0–0.7)
Eosinophils Relative: 1 %
HCT: 39.3 % (ref 36.0–46.0)
Hemoglobin: 13.4 g/dL (ref 12.0–15.0)
Lymphocytes Relative: 24 %
Lymphs Abs: 1.1 10*3/uL (ref 0.7–4.0)
MCH: 30.2 pg (ref 26.0–34.0)
MCHC: 34.1 g/dL (ref 30.0–36.0)
MCV: 88.7 fL (ref 78.0–100.0)
MONO ABS: 0.4 10*3/uL (ref 0.1–1.0)
MONOS PCT: 9 %
Neutro Abs: 3.1 10*3/uL (ref 1.7–7.7)
Neutrophils Relative %: 66 %
Platelets: 278 10*3/uL (ref 150–400)
RBC: 4.43 MIL/uL (ref 3.87–5.11)
RDW: 13.3 % (ref 11.5–15.5)
WBC: 4.6 10*3/uL (ref 4.0–10.5)

## 2015-04-16 LAB — BASIC METABOLIC PANEL
Anion gap: 9 (ref 5–15)
BUN: 18 mg/dL (ref 6–20)
CALCIUM: 9.3 mg/dL (ref 8.9–10.3)
CO2: 22 mmol/L (ref 22–32)
CREATININE: 0.7 mg/dL (ref 0.44–1.00)
Chloride: 106 mmol/L (ref 101–111)
GFR calc non Af Amer: >60 mL/min (ref >60)
Glucose, Bld: 153 mg/dL — ABNORMAL HIGH (ref 65–99)
Potassium: 3.2 mmol/L — ABNORMAL LOW (ref 3.5–5.1)
SODIUM: 137 mmol/L (ref 135–145)

## 2015-04-16 LAB — TROPONIN I

## 2015-04-16 NOTE — Discharge Instructions (Signed)
Palpitations A palpitation is the feeling that your heartbeat is irregular. It may feel like your heart is fluttering or skipping a beat. It may also feel like your heart is beating faster than normal. This is usually not a serious problem. In some cases, you may need more medical tests. HOME CARE  Avoid:  Caffeine in coffee, tea, soft drinks, diet pills, and energy drinks.  Chocolate.  Alcohol.  Stop smoking if you smoke.  Reduce your stress and anxiety. Try:  A method that measures bodily functions so you can learn to control them (biofeedback).  Yoga.  Meditation.  Physical activity such as swimming, jogging, or walking.  Get plenty of rest and sleep. GET HELP IF:  Your fast or irregular heartbeat continues after 24 hours.  Your palpitations occur more often. GET HELP RIGHT AWAY IF:   You have chest pain.  You feel short of breath.  You have a very bad headache.  You feel dizzy or pass out (faint). MAKE SURE YOU:   Understand these instructions.  Will watch your condition.  Will get help right away if you are not doing well or get worse.   This information is not intended to replace advice given to you by your health care provider. Make sure you discuss any questions you have with your health care provider.   Document Released: 01/08/2008 Document Revised: 04/21/2014 Document Reviewed: 05/30/2011 Elsevier Interactive Patient Education 2016 Elsevier Inc.  

## 2015-04-16 NOTE — ED Provider Notes (Signed)
CSN: [id number]     Arrival date & time 04/16/15  1501 History  By signing my name below, I, [name], attest that this documentation has been prepared under the direction and in the presence of [name], MD. Electronically Signed: [name], ED Scribe. 04/16/2015. 3:56 PM.     Chief Complaint  Patient presents with  . Palpitations   The history is provided by the patient. No language interpreter was used.   HPI Comments: Amber Escobar is a 65 y.o. female with a PMHx of endometriosis and HLD who presents to the Emergency Department complaining of intermittent palpitations onset several months ago and significantly worsening as of last night. Pt states she woke up during the night with palpitations ("my heart starts racing and I get nauseated"), then went back to sleep and woke up this morning with palpitations again. She reports associated diarrhea, upper-back pain across the shoulder blades (rated at an 8/10 at worst, currently rated as a 7/10), as well as left arm pain and numbness onset after arriving at the ED today. Back pain is worse with movement. No dyspnea.  She notes she was seen by her PCP (Dr Martinique at [facility]), where she was prescribed Prilosec, which only worsened the palpitations. She notes her PCP suspects she may have GERD. She reports a PMHx of high cholesterol and notes she takes baby aspirin daily. She also notes a PMHx of a freckle on the back of her retina, for which she has been seen by a specialist already, and which was diagnosed as benign. She denies any recent back injuries. She also denies a PMHx of DVT or PE.   Past Medical History  Diagnosis Date  . Endometriosis   . Hyperlipidemia   . Urinary incontinence   . 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 Varicose vein surgery Left 12/2011    left left  . Midurethral sling  2008    double right ureter   Family History  Problem Relation Age of Onset  .  Hypertension Mother   . Rheum arthritis Father   . Congestive Heart Failure Father    Social History  Substance Use Topics  . Smoking status: Never Smoker   . Smokeless tobacco: Never Used  . Alcohol Use: 6.0 - 8.4 oz/week    10-14 Glasses of wine per week   OB History    Gravida Para Term Preterm AB TAB SAB Ectopic Multiple Living   2 2 2       2      Review of Systems  Cardiovascular: Positive for palpitations.  Gastrointestinal: Positive for nausea and diarrhea.  Musculoskeletal: Positive for myalgias and back pain.  Neurological: Positive for numbness.  All other systems reviewed and are negative.   Allergies  Review of patient's allergies indicates no known allergies.  Home Medications   Prior to Admission medications   Medication Sig Start Date End Date Taking? Authorizing Provider  aspirin 81 MG tablet Take 81 mg by mouth daily.    Historical Provider, MD  Calcium-Phosphorus-Vitamin D (CITRACAL +D3 PO) Take by mouth daily.    Historical Provider, MD  clobetasol ointment (TEMOVATE) AB-123456789 % Apply 1 application topically as needed. 09/25/12   Historical Provider, MD  CVS GENTLE LAXATIVE 5 MG EC tablet See admin instructions. 02/05/15   Historical Provider, MD  Ergocalciferol (VITAMIN D2) 2000 UNITS TABS Take by mouth daily.    Historical Provider, MD  fluticasone Asencion Islam)  50 MCG/ACT nasal spray  01/15/14   Historical Provider, MD  Multiple Vitamin (MULTIVITAMIN) tablet Take 1 tablet by mouth daily.    Historical Provider, MD  NON FORMULARY Phytoserol Complex 937mg  2x daily Beta Sistosterol 375mg  2x daily To help with Cholesterol    Historical Provider, MD  Omega-3 Fatty Acids (FISH OIL) 1200 MG CAPS Take by mouth daily.    Historical Provider, MD  ondansetron (ZOFRAN) 4 MG tablet TAKE 1 TABLET THREE TIMES A DAY AS NEEDED ORALLY 7 DAYS 02/09/15   Historical Provider, MD  polyethylene glycol-electrolytes (NULYTELY/GOLYTELY) 420 G solution See admin instructions. 02/05/15    Historical Provider, MD  Probiotic CAPS Take by mouth daily.    Historical Provider, MD   BP 129/82 mmHg  Pulse 77  Temp(Src) 97.5 F (36.4 C) (Oral)  Resp 30  Ht 5\' 1"  (1.549 m)  Wt 133 lb (60.328 kg)  BMI 25.14 kg/m2  SpO2 92%  LMP 04/14/2002 Physical Exam  Constitutional: She is oriented to person, place, and time. She appears well-developed and well-nourished.  HENT:  Head: Normocephalic and atraumatic.  Right Ear: External ear normal.  Left Ear: External ear normal.  Nose: Nose normal.  Mouth/Throat: Oropharynx is clear and moist.  Eyes: Conjunctivae and EOM are normal. Pupils are equal, round, and reactive to light.  Neck: Normal range of motion. Neck supple. No JVD present. No tracheal deviation present. No thyromegaly present.  Cardiovascular: Normal rate, regular rhythm, normal heart sounds and intact distal pulses.  Exam reveals no gallop and no friction rub.   No murmur heard. HR is 80 and regular  Pulmonary/Chest: Effort normal and breath sounds normal. No respiratory distress. She has no wheezes. She has no rales.  Abdominal: Soft. Bowel sounds are normal. She exhibits no mass. There is no tenderness. There is no guarding.  Musculoskeletal: Normal range of motion.  Lymphadenopathy:    She has no cervical adenopathy.  Neurological: She is alert and oriented to person, place, and time. She has normal reflexes. No cranial nerve deficit or sensory deficit. Gait normal. GCS eye subscore is 4. GCS verbal subscore is 5. GCS motor subscore is 6.  Reflex Scores:      Bicep reflexes are 2+ on the right side and 2+ on the left side.      Patellar reflexes are 2+ on the right side and 2+ on the left side. Strength is normal and equal throughout. Cranial nerves grossly intact. Patient fluent. No gross ataxia and patient able to ambulate without difficulty.  Skin: Skin is warm and dry.  Psychiatric: She has a normal mood and affect. Her behavior is normal. Judgment and thought  content normal.  Nursing note and vitals reviewed.   ED Course  Procedures  DIAGNOSTIC STUDIES: Oxygen Saturation is 100% on RA, normal by my interpretation.    COORDINATION OF CARE: 3:44 PM - Discussed normal EKG results. Discussed plans to wait on diagnostic studies. Pt advised of plan for treatment and pt agrees.  Labs Review Labs Reviewed  BASIC METABOLIC PANEL - Abnormal; Notable for the following:    Potassium 3.2 (*)    Glucose, Bld 153 (*)    All other components within normal limits  CBC WITH DIFFERENTIAL/PLATELET  TROPONIN I    Imaging Review Dg Chest 2 View  04/16/2015  CLINICAL DATA:  Cardiac palpitations, worsening yesterday.  Nausea. EXAM: CHEST  2 VIEW COMPARISON:  05/16/2014 FINDINGS: The lungs appear clear.  Cardiac and mediastinal contours normal. No pleural effusion  identified. Mild thoracic spondylosis is present. IMPRESSION: 1. No acute thoracic findings.  Mild thoracic spondylosis. Electronically Signed   By: Van Clines M.D.   On: 04/16/2015 16:28   I have personally reviewed and evaluated these images and lab results as part of my medical decision-making.   EKG Interpretation   Date/Time:  Monday April 16 2015 15:13:07 EST Ventricular Rate:  84 PR Interval:  206 QRS Duration: 74 QT Interval:  368 QTC Calculation: 434 R Axis:   74 Text Interpretation:  Normal sinus rhythm Normal ECG Confirmed by Braxtyn Dorff MD,  Andee Poles [id number]) on 04/16/2015 3:32:57 PM      MDM   Final diagnoses:  Palpitations  Upper back pain   65 y.o. Female with palpitations for a year worse now with associated upper back pain which sounds musculoskeletal. No s/s of cardiac etiology with normal ekg and negative troponin (pain has been present for extended period of time.).  Patient advised to follow up with pmd for possible cardiac monitor.  We discussed return precautions and she voices understanding.  I personally performed the services described in this documentation,  which was scribed in my presence. The recorded information has been reviewed and considered.   [name], MD 04/16/15 2308

## 2015-04-16 NOTE — ED Notes (Signed)
She has a hx of palpitations. Woke with nausea this am. Palpitations have gotten worse through out the day.

## 2015-04-16 NOTE — ED Notes (Signed)
Patient stable and ambulatory. Patient verbalizes understanding of discharge instructions and follow-up. 

## 2015-04-19 ENCOUNTER — Telehealth: Payer: Self-pay

## 2015-04-19 ENCOUNTER — Encounter: Payer: Self-pay | Admitting: Obstetrics and Gynecology

## 2015-04-19 NOTE — Telephone Encounter (Signed)
Telephone encounter created to discuss with Dr.Silva.

## 2015-04-19 NOTE — Telephone Encounter (Signed)
Consider Dr. Dorris Carnes for cardiology consultation.

## 2015-04-19 NOTE — Telephone Encounter (Signed)
Non-Urgent Medical Question  Message [id number]   From  Nashley Maunu   To  Nunzio Cobbs, MD   Sent  04/19/2015 9:10 AM     A recent ER visit for nausea and palpitations has resulted in a request for me to follow up with a cardiologist. My PCP recently retired and I have a new PCP. However,I feel that Dr. Quincy Simmonds, having a longer history with me, may be better able to recommend a cardiologist specializing in women's cardiac issues who would best fit my needs and personality.   I am not requesting a referral but would appreciate any recommendation Dr. Quincy Simmonds could provide.   I have a follow up with my new PCP on Tuesday 04/24/15 and would like to have options available to me should it be determined that I need to see a cardiologist.   Thank you      Responsible Party    Pool - [facility] Clinical Pool No one has taken responsibility for this message.     No actions have been taken on this message.     Routing to [place] for review and recommendations.

## 2015-04-20 NOTE — Telephone Encounter (Signed)
Spoke with patient. Advised of message as seen below from [place]. Patient is agreeable. Patient is thankful for recommendation.   Routing to provider for final review. Patient agreeable to disposition. Will close encounter.

## 2015-04-26 ENCOUNTER — Encounter: Payer: Self-pay | Admitting: Obstetrics and Gynecology

## 2015-05-02 ENCOUNTER — Ambulatory Visit: Payer: Self-pay | Admitting: Obstetrics and Gynecology

## 2015-05-15 ENCOUNTER — Encounter: Payer: Self-pay | Admitting: Internal Medicine

## 2015-05-16 ENCOUNTER — Ambulatory Visit: Payer: BLUE CROSS/BLUE SHIELD | Admitting: Cardiovascular Disease

## 2015-05-24 ENCOUNTER — Encounter: Payer: Self-pay | Admitting: Internal Medicine

## 2015-05-24 ENCOUNTER — Ambulatory Visit (INDEPENDENT_AMBULATORY_CARE_PROVIDER_SITE_OTHER): Payer: BLUE CROSS/BLUE SHIELD | Admitting: Internal Medicine

## 2015-05-24 VITALS — BP 134/76 | HR 70 | Ht 61.0 in | Wt 135.1 lb

## 2015-05-24 DIAGNOSIS — R002 Palpitations: Secondary | ICD-10-CM | POA: Diagnosis not present

## 2015-05-24 NOTE — Patient Instructions (Signed)
Your physician recommends that you continue on your current medications as directed. Please refer to the Current Medication list given to you today.  Your physician has recommended that you wear an event monitor. Event monitors are medical devices that record the heart's electrical activity. Doctors most often us these monitors to diagnose arrhythmias. Arrhythmias are problems with the speed or rhythm of the heartbeat. The monitor is a small, portable device. You can wear one while you do your normal daily activities. This is usually used to diagnose what is causing palpitations/syncope (passing out).   

## 2015-05-24 NOTE — Progress Notes (Addendum)
Cardiology Office Note   Date:  05/24/2015   ID:  Amber Escobar, DOB 1951/01/10, MRN [id number]  PCP:  Martinique, BETTY G, MD  Cardiologist:   Dorris Carnes, MD    Pt presents on referral from Dr Martinique for palpitations and pain   History of Present Illness: Amber Escobar is a 65 y.o. female with a history of palpitations   Heart pounding at times Started in September   Seen in ER on 04/16/15  Woke up from sleep with palpitations  Associated with Nausea.  Alos had upper back pain (between shoulder blades)  L arm numb.  Pain in back worse with movment  No dyspnea.  Sent out of ER   Since the she has had other spells  None as severe  Has been under increased stress  Wonders if associated.   She is otherwise actvie  Excercises  Does not have a problem while active      Current Outpatient Prescriptions  Medication Sig Dispense Refill  . aspirin 81 MG tablet Take 81 mg by mouth daily.    . Cholecalciferol (VITAMIN D-3) 1000 units CAPS Take 1,000 Units by mouth daily.    . clobetasol ointment (TEMOVATE) AB-123456789 % Apply 1 application topically as needed.    . fluticasone (FLONASE) 50 MCG/ACT nasal spray Place 1 spray into both nostrils daily.     . Multiple Vitamin (MULTIVITAMIN) tablet Take 1 tablet by mouth daily.    . Omega-3 Fatty Acids (FISH OIL) 1200 MG CAPS Take by mouth daily.    . ondansetron (ZOFRAN) 4 MG tablet TAKE 1 TABLET THREE TIMES A DAY AS NEEDED ORALLY 7 DAYS  0  . Phytosterol Esters (PHYTOSTEROL COMPLEX PO) Take 937 mg by mouth 2 (two) times daily.    . Probiotic CAPS Take by mouth daily.     No current facility-administered medications for this visit.    Allergies:   Review of patient's allergies indicates no known allergies.   Past Medical History  Diagnosis Date  . Endometriosis   . Hyperlipidemia   . Urinary incontinence   . 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 Asthma   . Tubular adenoma of colon   . H/O colonoscopy 2012  . Cervical cancer [facility])     Past Surgical History   Procedure Laterality Date  . Varicose vein surgery Left 12/2011    left left  . Midurethral sling  2008    double right ureter     Social History:  The patient  reports that she has never smoked. She has never used smokeless tobacco. She reports that she drinks about 6.0 - 8.4 oz of alcohol per week. She reports that she does not use illicit drugs.   Family History:  The patient's family history includes Congestive Heart Failure in her father; Hypertension in her mother; Rheum arthritis in her father.    ROS:  Please see the history of present illness. All other systems are reviewed and  Negative to the above problem except as noted.    PHYSICAL EXAM: VS:  BP 134/76 mmHg  Pulse 70  Ht 5\' 1"  (1.549 m)  Wt 135 lb 1.9 oz (61.29 kg)  BMI 25.54 kg/m2  SpO2 97%  LMP 04/14/2002  GEN: Well nourished, well developed, in no acute distress HEENT: normal Neck: no JVD, carotid bruits, or masses Cardiac: RRR; no murmurs, rubs, or gallops,no edema  Respiratory:  clear to auscultation bilaterally, normal work of breathing GI: soft, nontender, nondistended, + BS  No hepatomegaly  MS: no deformity Moving all extremities   Skin: warm and dry, no rash Neuro:  Strength and sensation are intact Psych: euthymic mood, full affect   EKG:  EKG is not ordered today.  On  04/16/15 SR 84 bpm     Lipid Panel No results found for: CHOL, TRIG, HDL, CHOLHDL, VLDL, LDLCALC, LDLDIRECT    Wt Readings from Last 3 Encounters:  05/24/15 135 lb 1.9 oz (61.29 kg)  04/16/15 133 lb (60.328 kg)  02/23/15 133 lb (60.328 kg)      ASSESSMENT AND PLAN:  Pt's symptoms are complex/ somewhat complicated.   She is not orhostatic on exam today       1  Palpitations   I am not convinced of an arrhythmia but patient is very symptomatic from them  I would recomm an event monitor to capture episodes  COntinue activites as tolerated  2.  Back pain  I do not think these represent angina  Follow      Disposition:    FU with me will depend on tests results    Signed, Dorris Carnes, MD  05/24/2015 2:54 PM    [facility] [place], [place], [place]  [address] Phone: [phone]; Fax: [phone]

## 2015-05-25 ENCOUNTER — Ambulatory Visit (INDEPENDENT_AMBULATORY_CARE_PROVIDER_SITE_OTHER): Payer: BLUE CROSS/BLUE SHIELD

## 2015-05-25 DIAGNOSIS — R002 Palpitations: Secondary | ICD-10-CM | POA: Diagnosis not present

## 2015-09-05 ENCOUNTER — Other Ambulatory Visit (HOSPITAL_COMMUNITY): Payer: Self-pay | Admitting: Gastroenterology

## 2015-09-05 DIAGNOSIS — K769 Liver disease, unspecified: Secondary | ICD-10-CM

## 2015-09-06 ENCOUNTER — Other Ambulatory Visit (HOSPITAL_COMMUNITY): Payer: Self-pay | Admitting: Gastroenterology

## 2015-09-06 ENCOUNTER — Inpatient Hospital Stay
Admission: RE | Admit: 2015-09-06 | Discharge: 2015-09-06 | Disposition: A | Discharge status: Home / Self Care | Payer: Self-pay | Source: Ambulatory Visit | Attending: Gastroenterology | Admitting: Gastroenterology

## 2015-09-06 DIAGNOSIS — R52 Pain, unspecified: Secondary | ICD-10-CM

## 2015-09-25 ENCOUNTER — Other Ambulatory Visit: Payer: Self-pay | Admitting: Physician Assistant

## 2015-09-25 ENCOUNTER — Other Ambulatory Visit: Payer: Self-pay | Admitting: Radiology

## 2015-09-26 ENCOUNTER — Encounter (HOSPITAL_COMMUNITY): Payer: Self-pay

## 2015-09-26 ENCOUNTER — Ambulatory Visit (HOSPITAL_COMMUNITY)
Admission: RE | Admit: 2015-09-26 | Discharge: 2015-09-26 | Disposition: A | Discharge status: Home / Self Care | Payer: BLUE CROSS/BLUE SHIELD | Source: Ambulatory Visit | Attending: Gastroenterology | Admitting: Gastroenterology

## 2015-09-26 DIAGNOSIS — K769 Liver disease, unspecified: Secondary | ICD-10-CM

## 2015-09-26 DIAGNOSIS — Z79899 Other long term (current) drug therapy: Secondary | ICD-10-CM | POA: Diagnosis not present

## 2015-09-26 DIAGNOSIS — Z7982 Long term (current) use of aspirin: Secondary | ICD-10-CM | POA: Diagnosis not present

## 2015-09-26 DIAGNOSIS — K7689 Other specified diseases of liver: Secondary | ICD-10-CM | POA: Diagnosis present

## 2015-09-26 DIAGNOSIS — E785 Hyperlipidemia, unspecified: Secondary | ICD-10-CM | POA: Diagnosis not present

## 2015-09-26 LAB — PROTIME-INR
INR: 1.08 (ref 0.00–1.49)
Prothrombin Time: 14.2 seconds (ref 11.6–15.2)

## 2015-09-26 LAB — CBC
HEMATOCRIT: 39.9 % (ref 36.0–46.0)
Hemoglobin: 13.4 g/dL (ref 12.0–15.0)
MCH: 30.7 pg (ref 26.0–34.0)
MCHC: 33.6 g/dL (ref 30.0–36.0)
MCV: 91.3 fL (ref 78.0–100.0)
Platelets: 270 10*3/uL (ref 150–400)
RBC: 4.37 MIL/uL (ref 3.87–5.11)
RDW: 13.9 % (ref 11.5–15.5)
WBC: 3.8 10*3/uL — ABNORMAL LOW (ref 4.0–10.5)

## 2015-09-26 LAB — APTT: aPTT: 30 seconds (ref 24–37)

## 2015-09-26 MED ORDER — FENTANYL CITRATE (PF) 100 MCG/2ML IJ SOLN
INTRAMUSCULAR | Status: AC
  Filled 2015-09-26: qty 2

## 2015-09-26 MED ORDER — FENTANYL CITRATE (PF) 100 MCG/2ML IJ SOLN
INTRAMUSCULAR | Status: AC | PRN
  Administered 2015-09-26: 50 ug via INTRAVENOUS

## 2015-09-26 MED ORDER — LIDOCAINE-EPINEPHRINE 1 %-1:100000 IJ SOLN
INTRAMUSCULAR | Status: AC
  Filled 2015-09-26: qty 1

## 2015-09-26 MED ORDER — MIDAZOLAM HCL 2 MG/2ML IJ SOLN
INTRAMUSCULAR | Status: AC | PRN
  Administered 2015-09-26: 1 mg via INTRAVENOUS

## 2015-09-26 MED ORDER — SODIUM CHLORIDE 0.9 % IV SOLN
INTRAVENOUS | Status: AC | PRN
  Administered 2015-09-26: 10 mL/h via INTRAVENOUS

## 2015-09-26 MED ORDER — MIDAZOLAM HCL 2 MG/2ML IJ SOLN
INTRAMUSCULAR | Status: AC
  Filled 2015-09-26: qty 2

## 2015-09-26 MED ORDER — SODIUM CHLORIDE 0.9 % IV SOLN: INTRAVENOUS | Status: DC

## 2015-09-26 NOTE — Sedation Documentation (Signed)
Patient denies pain and is resting comfortably.  

## 2015-09-26 NOTE — Procedures (Signed)
Technically successful US guided aspirtatoin of approximately 10 cc of very viscous dark red/brown fluid from complex cyst in right lobe of the liver.   EBL: None No immediate complications.   Ronny Bacon, MD Pager #: [phone]

## 2015-09-26 NOTE — Sedation Documentation (Signed)
10 cc serosanguinous fluid removed from aspiration site

## 2015-09-26 NOTE — Sedation Documentation (Signed)
Patient is resting comfortably. 

## 2015-09-26 NOTE — H&P (Signed)
Chief Complaint: Patient was seen in consultation today for liver lesion aspiration at the request of [place]  Referring Physician(s): Arta Silence  Supervising Physician: Sandi Mariscal  Patient Status: Outpatient  History of Present Illness: Amber Escobar is a 65 y.o. female   Noticed nausea off and on x over 1 yr Occasional Rt sided flank pain Symptoms worsened and was evaluated CT then MRI at [facility]: reveals large hepatic cyst Referred to Dr Paulita Fujita Now scheduled for aspiration---for cytology  Past Medical History  Diagnosis Date  . Endometriosis   . Hyperlipidemia   . Urinary incontinence   . 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 Asthma   . Tubular adenoma of colon   . H/O colonoscopy 2012  . Cervical cancer [facility])     Past Surgical History  Procedure Laterality Date  . Varicose vein surgery Left 12/2011    left left  . Midurethral sling  2008    double right ureter    Allergies: Review of patient's allergies indicates no known allergies.  Medications: Prior to Admission medications   Medication Sig Start Date End Date Taking? Authorizing Provider  aspirin 81 MG tablet Take 81 mg by mouth daily.   Yes Historical Provider, MD  Cholecalciferol (VITAMIN D-3) 1000 units CAPS Take 1,000 Units by mouth daily.   Yes Historical Provider, MD  fluticasone (FLONASE) 50 MCG/ACT nasal spray Place 1 spray into both nostrils daily.  01/15/14  Yes Historical Provider, MD  Multiple Vitamin (MULTIVITAMIN) tablet Take 1 tablet by mouth daily.   Yes Historical Provider, MD  Omega-3 Fatty Acids (FISH OIL) 1200 MG CAPS Take by mouth daily.   Yes Historical Provider, MD  Phytosterol Esters (PHYTOSTEROL COMPLEX PO) Take 937 mg by mouth 2 (two) times daily.   Yes Historical Provider, MD  Probiotic CAPS Take by mouth daily.   Yes Historical Provider, MD  ondansetron (ZOFRAN) 4 MG tablet TAKE 1 TABLET THREE TIMES A DAY AS NEEDED ORALLY 7 DAYS 02/09/15   Historical Provider, MD      Family History  Problem Relation Age of Onset  . Hypertension Mother   . Rheum arthritis Father   . Congestive Heart Failure Father     Social History   Social History  . Marital Status: Married    Spouse Name: N/A  . Number of Children: N/A  . Years of Education: N/A   Occupational History  . MEDICAL BILLER    Social History Main Topics  . Smoking status: Never Smoker   . Smokeless tobacco: Never Used  . Alcohol Use: 6.0 - 8.4 oz/week    10-14 Glasses of wine per week  . Drug Use: No  . Sexual Activity:    Partners: Male    Birth Control/ Protection: Post-menopausal   Other Topics Concern  . None   Social History Narrative     Review of Systems: A 12 point ROS discussed and pertinent positives are indicated in the HPI above.  All other systems are negative.  Review of Systems  Constitutional: Negative for fever, activity change and fatigue.  Respiratory: Negative for shortness of breath.   Gastrointestinal: Positive for nausea. Negative for abdominal pain.  Genitourinary: Positive for flank pain.  Neurological: Negative for weakness.  Psychiatric/Behavioral: Negative for behavioral problems and confusion.    Vital Signs: BP 128/81 mmHg  Pulse 70  Temp(Src) 97.6 F (36.4 C) (Oral)  Resp 16  Ht 5\' 1"  (1.549 m)  Wt 128 lb (58.06 kg)  BMI  24.20 kg/m2  SpO2 100%  LMP 04/14/2002  Physical Exam  Constitutional: She is oriented to person, place, and time.  Cardiovascular: Normal rate, regular rhythm and normal heart sounds.   Pulmonary/Chest: Effort normal and breath sounds normal. She has no wheezes.  Abdominal: Soft. Bowel sounds are normal. There is no tenderness.  Musculoskeletal: Normal range of motion.  Neurological: She is alert and oriented to person, place, and time.  Skin: Skin is warm and dry.  Psychiatric: She has a normal mood and affect. Her behavior is normal. Judgment and thought content normal.  Nursing note and vitals  reviewed.   Mallampati Score:  MD Evaluation Airway: WNL Heart: WNL Abdomen: WNL Chest/ Lungs: WNL ASA  Classification: 2 Mallampati/Airway Score: One  Imaging: Mr Outside Films Body  09/06/2015  This examination belongs to an outside facility and is stored here for comparison purposes only.  Contact the originating outside institution for any associated report or interpretation.   Labs:  CBC:  Recent Labs  04/16/15 1535 09/26/15 0605  WBC 4.6 3.8*  HGB 13.4 13.4  HCT 39.3 39.9  PLT 278 270    COAGS:  Recent Labs  09/26/15 0605  INR 1.08  APTT 30    BMP:  Recent Labs  04/16/15 1535  NA 137  K 3.2*  CL 106  CO2 22  GLUCOSE 153*  BUN 18  CALCIUM 9.3  CREATININE 0.70  GFRNONAA >60  GFRAA >60    LIVER FUNCTION TESTS: No results for input(s): BILITOT, AST, ALT, ALKPHOS, PROT, ALBUMIN in the last 8760 hours.  TUMOR MARKERS: No results for input(s): AFPTM, CEA, CA199, CHROMGRNA in the last 8760 hours.  Assessment and Plan:  Large liver lesion/cyst Scheduled for aspiration Risks and Benefits discussed with the patient including, but not limited to bleeding, infection, damage to adjacent structures or low yield requiring additional tests. All of the patient's questions were answered, patient is agreeable to proceed. Consent signed and in chart.   Thank you for this interesting consult.  I greatly enjoyed meeting Yanice Pross and look forward to participating in their care.  A copy of this report was sent to the requesting provider on this date.  Electronically Signed: Avionna Bower A 09/26/2015, 7:20 AM   I spent a total of  30 Minutes   in face to face in clinical consultation, greater than 50% of which was counseling/coordinating care for liver lesion aspiration

## 2015-09-26 NOTE — Discharge Instructions (Signed)
Liver Biopsy, Care After °Refer to this sheet in the next few weeks. These instructions provide you with information on caring for yourself after your procedure. Your health care provider may also give you more specific instructions. Your treatment has been planned according to current medical practices, but problems sometimes occur. Call your health care provider if you have any problems or questions after your procedure. °WHAT TO EXPECT AFTER THE PROCEDURE °After your procedure, it is typical to have the following: °· A small amount of discomfort in the area where the biopsy was done and in the right shoulder or shoulder blade. °· A small amount of bruising around the area where the biopsy was done and on the skin over the liver. °· Sleepiness and fatigue for the rest of the day. °HOME CARE INSTRUCTIONS  °· Rest at home for 1-2 days or as directed by your health care provider. °· Have a friend or family member stay with you for at least 24 hours. °· Because of the medicines used during the procedure, you should not do the following things in the first 24 hours: °¨ Drive. °¨ Use machinery. °¨ Be responsible for the care of other people. °¨ Sign legal documents. °¨ Take a bath or shower. °· There are many different ways to close and cover an incision, including stitches, skin glue, and adhesive strips. Follow your health care provider's instructions on: °¨ Incision care. °¨ Bandage (dressing) changes and removal. °¨ Incision closure removal. °· Do not drink alcohol in the first week. °· Do not lift more than 5 pounds or play contact sports for 2 weeks after this test. °· Take medicines only as directed by your health care provider. Do not take medicine containing aspirin or non-steroidal anti-inflammatory medicines such as ibuprofen for 1 week after this test. °· It is your responsibility to get your test results. °SEEK MEDICAL CARE IF:  °· You have increased bleeding from an incision that results in more than a  small spot of blood. °· You have redness, swelling, or increasing pain in any incisions. °· You notice a discharge or a bad smell coming from any of your incisions. °· You have a fever or chills. °SEEK IMMEDIATE MEDICAL CARE IF:  °· You develop swelling, bloating, or pain in your abdomen. °· You become dizzy or faint. °· You develop a rash. °· You are nauseous or vomit. °· You have difficulty breathing, feel short of breath, or feel faint. °· You develop chest pain. °· You have problems with your speech or vision. °· You have trouble balancing or moving your arms or legs. °  °This information is not intended to replace advice given to you by your health care provider. Make sure you discuss any questions you have with your health care provider. °  °Document Released: 10/18/2004 Document Revised: 04/21/2014 Document Reviewed: 05/27/2013 °Elsevier Interactive Patient Education ©2016 [employer] Inc[employer] ° °

## 2015-11-09 ENCOUNTER — Other Ambulatory Visit (HOSPITAL_COMMUNITY): Payer: Self-pay | Admitting: General Surgery

## 2015-11-09 DIAGNOSIS — R110 Nausea: Secondary | ICD-10-CM

## 2015-11-16 ENCOUNTER — Encounter (HOSPITAL_COMMUNITY)
Admission: RE | Admit: 2015-11-16 | Discharge: 2015-11-16 | Disposition: A | Discharge status: Home / Self Care | Payer: BLUE CROSS/BLUE SHIELD | Source: Ambulatory Visit | Attending: General Surgery | Admitting: General Surgery

## 2015-11-16 DIAGNOSIS — R110 Nausea: Secondary | ICD-10-CM | POA: Diagnosis present

## 2015-11-16 MED ORDER — TECHNETIUM TC 99M MEBROFENIN IV KIT
5.0000 | PACK | Freq: Once | INTRAVENOUS | Status: AC | PRN
  Administered 2015-11-16: 5 via INTRAVENOUS

## 2015-11-23 NOTE — Progress Notes (Signed)
Please let patient know that gallbladder appeared normal on the HIDA scan.

## 2016-02-01 ENCOUNTER — Other Ambulatory Visit: Payer: Self-pay | Admitting: Obstetrics and Gynecology

## 2016-02-01 DIAGNOSIS — Z1231 Encounter for screening mammogram for malignant neoplasm of breast: Secondary | ICD-10-CM

## 2016-03-13 ENCOUNTER — Ambulatory Visit
Admission: RE | Admit: 2016-03-13 | Discharge: 2016-03-13 | Disposition: A | Discharge status: Home / Self Care | Payer: BLUE CROSS/BLUE SHIELD | Source: Ambulatory Visit | Attending: Obstetrics and Gynecology | Admitting: Obstetrics and Gynecology

## 2016-03-13 DIAGNOSIS — Z1231 Encounter for screening mammogram for malignant neoplasm of breast: Secondary | ICD-10-CM

## 2016-03-13 NOTE — Progress Notes (Signed)
65 y.o. G38P2002 Married Caucasian female here for annual exam.    Has periodic rash on her bottom and wants a refill of Mycolog II. Has recurrence this summer when using the pool a lot.   Had palpitations and nausea this year.  Saw cardiology.  Saw general surgery due to hepatic cyst.  Thinks she was dealing with a lot of grief at the time.   Denies postmenopausal bleeding.   Good bladder control. No infections.   Building a new house which will be ready soon.  PCP:  [facility]  Patient's last menstrual period was 04/14/2002.       Sexually active: No. female The current method of family planning is post menopausal status.    Exercising: Yes.    Strength, aerobics, gardening and housework Smoker:  no  Health Maintenance: Pap:  02-23-15 Neg:Neg HR HPV History of abnormal Pap:  no MMG:  03-13-15 Density C/Neg/BiRads1:[facility]--Had mmg 03-13-16--report pend. Colonoscopy: 02/2015 polyps with Dr.Outlaw([facility]);next due 02/2020. BMD:   2001  Result  Normal in [place] TDaP:  PCP--2013 Gardasil:   N/A Hep C:  Will do with PCP.  Screening Labs:  Hb today: PCP, Urine today: PCP   reports that she has never smoked. She has never used smokeless tobacco. She reports that she drinks about 4.2 - 6.0 oz of alcohol per week . She reports that she does not use drugs.  Past Medical History:  Diagnosis Date  . Asthma   . Benign liver cyst    hx of benign liver cyst--Dr.Outlaw  . Cervical cancer ([place])   . 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 Endometriosis   . H/O colonoscopy 2012  . Hyperlipidemia   . Tubular adenoma of colon   . Urinary incontinence     Past Surgical History:  Procedure Laterality Date  . midurethral sling  2008   double right ureter  . VARICOSE VEIN SURGERY Left 12/2011   left left    Current Outpatient Prescriptions  Medication Sig Dispense Refill  . aspirin 81 MG tablet Take 81 mg by mouth daily.    . Cholecalciferol (VITAMIN D-3) 1000 units  CAPS Take 1,000 Units by mouth daily.    . fluticasone (FLONASE) 50 MCG/ACT nasal spray Place 1 spray into both nostrils daily.     . Multiple Vitamin (MULTIVITAMIN) tablet Take 1 tablet by mouth daily.    . Omega-3 Fatty Acids (FISH OIL) 1200 MG CAPS Take by mouth daily.    Marland Kitchen Phytosterol Esters (PHYTOSTEROL COMPLEX PO) Take 937 mg by mouth 2 (two) times daily.    . Probiotic CAPS Take by mouth daily.     No current facility-administered medications for this visit.     Family History  Problem Relation Age of Onset  . Hypertension Mother   . Rheum arthritis Father   . Congestive Heart Failure Father     ROS:  Pertinent items are noted in HPI.  Otherwise, a comprehensive ROS was negative.  Exam:   BP 138/80 (BP Location: Right Arm, Patient Position: Sitting, Cuff Size: Normal)   Pulse 66   Resp 14   Ht 5' 1.5" (1.562 m)   Wt 133 lb (60.3 kg)   LMP 04/14/2002   BMI 24.72 kg/m     General appearance: alert, cooperative and appears stated age Head: Normocephalic, without obvious abnormality, atraumatic Neck: no adenopathy, supple, symmetrical, trachea midline and thyroid normal to inspection and palpation Lungs: clear to auscultation bilaterally Breasts:  normal appearance, no masses or tenderness, No nipple retraction or dimpling, No nipple discharge or bleeding, No axillary or supraclavicular adenopathy Heart: regular rate and rhythm Abdomen: soft, non-tender; no masses, no organomegaly Extremities: extremities normal, atraumatic, no cyanosis or edema Skin: Skin color, texture, turgor normal. No rashes or lesions Lymph nodes: Cervical, supraclavicular, and axillary nodes normal. No abnormal inguinal nodes palpated Neurologic: Grossly normal  Pelvic: External genitalia:  no lesions              Urethra:  normal appearing urethra with no masses, tenderness or lesions              Bartholins and Skenes: normal                 Vagina: normal appearing vagina with normal color and  discharge, no lesions.  Good support.              Cervix: no lesions              Pap taken: No. Bimanual Exam:  Uterus:  normal size, contour, position, consistency, mobility, non-tender              Adnexa: no mass, fullness, tenderness              Rectal exam: Yes.  .  Confirms.              Anus:  normal sphincter tone, no lesions  Chaperone was present for exam.  Assessment:   Well woman visit with normal exam. Menopausal female.  Probable small fibroid on CT scan.  Hx of midurethral sling.  Request for Mycology II.   Plan: Yearly mammogram recommended after age 26.  Recommended self breast exam.  Pap and HR HPV as above. Discussed Calcium, Vitamin D, regular exercise program including cardiovascular and weight bearing exercise. Will check Hep C aby with PCP at a follow up visit there.  BMD ordered.  Refill of Mycolog II. Follow up annually and prn.       After visit summary provided.

## 2016-03-14 ENCOUNTER — Encounter: Payer: Self-pay | Admitting: Obstetrics and Gynecology

## 2016-03-14 ENCOUNTER — Ambulatory Visit (INDEPENDENT_AMBULATORY_CARE_PROVIDER_SITE_OTHER): Payer: BLUE CROSS/BLUE SHIELD | Admitting: Obstetrics and Gynecology

## 2016-03-14 VITALS — BP 138/80 | HR 66 | Resp 14 | Ht 61.5 in | Wt 133.0 lb

## 2016-03-14 DIAGNOSIS — Z780 Asymptomatic menopausal state: Secondary | ICD-10-CM

## 2016-03-14 DIAGNOSIS — Z01419 Encounter for gynecological examination (general) (routine) without abnormal findings: Secondary | ICD-10-CM

## 2016-03-14 DIAGNOSIS — C539 Malignant neoplasm of cervix uteri, unspecified: Secondary | ICD-10-CM | POA: Insufficient documentation

## 2016-03-14 MED ORDER — NYSTATIN-TRIAMCINOLONE 100000-0.1 UNIT/GM-% EX CREA: 1.0000 "application " | TOPICAL_CREAM | Freq: Two times a day (BID) | CUTANEOUS | 0 refills | Status: DC

## 2016-03-14 NOTE — Patient Instructions (Signed)

## 2016-03-27 ENCOUNTER — Other Ambulatory Visit: Payer: BLUE CROSS/BLUE SHIELD

## 2016-03-28 ENCOUNTER — Ambulatory Visit
Admission: RE | Admit: 2016-03-28 | Discharge: 2016-03-28 | Disposition: A | Discharge status: Home / Self Care | Payer: BLUE CROSS/BLUE SHIELD | Source: Ambulatory Visit | Attending: Obstetrics and Gynecology | Admitting: Obstetrics and Gynecology

## 2016-03-28 DIAGNOSIS — Z780 Asymptomatic menopausal state: Secondary | ICD-10-CM

## 2016-03-30 ENCOUNTER — Encounter: Payer: Self-pay | Admitting: Obstetrics and Gynecology

## 2016-06-12 DIAGNOSIS — D649 Anemia, unspecified: Secondary | ICD-10-CM | POA: Diagnosis not present

## 2016-06-12 DIAGNOSIS — Z1159 Encounter for screening for other viral diseases: Secondary | ICD-10-CM | POA: Diagnosis not present

## 2016-06-12 DIAGNOSIS — E782 Mixed hyperlipidemia: Secondary | ICD-10-CM | POA: Diagnosis not present

## 2016-07-07 DIAGNOSIS — D2262 Melanocytic nevi of left upper limb, including shoulder: Secondary | ICD-10-CM | POA: Diagnosis not present

## 2016-07-07 DIAGNOSIS — L814 Other melanin hyperpigmentation: Secondary | ICD-10-CM | POA: Diagnosis not present

## 2016-07-07 DIAGNOSIS — D225 Melanocytic nevi of trunk: Secondary | ICD-10-CM | POA: Diagnosis not present

## 2016-07-07 DIAGNOSIS — L720 Epidermal cyst: Secondary | ICD-10-CM | POA: Diagnosis not present

## 2016-07-07 DIAGNOSIS — D1801 Hemangioma of skin and subcutaneous tissue: Secondary | ICD-10-CM | POA: Diagnosis not present

## 2016-07-07 DIAGNOSIS — L821 Other seborrheic keratosis: Secondary | ICD-10-CM | POA: Diagnosis not present

## 2016-07-07 DIAGNOSIS — L918 Other hypertrophic disorders of the skin: Secondary | ICD-10-CM | POA: Diagnosis not present

## 2016-07-07 DIAGNOSIS — L819 Disorder of pigmentation, unspecified: Secondary | ICD-10-CM | POA: Diagnosis not present

## 2016-07-07 DIAGNOSIS — M7130 Other bursal cyst, unspecified site: Secondary | ICD-10-CM | POA: Diagnosis not present

## 2016-08-06 DIAGNOSIS — H6591 Unspecified nonsuppurative otitis media, right ear: Secondary | ICD-10-CM | POA: Diagnosis not present

## 2016-08-06 DIAGNOSIS — Z23 Encounter for immunization: Secondary | ICD-10-CM | POA: Diagnosis not present

## 2016-08-26 DIAGNOSIS — L570 Actinic keratosis: Secondary | ICD-10-CM | POA: Diagnosis not present

## 2016-08-26 DIAGNOSIS — H0262 Xanthelasma of right lower eyelid: Secondary | ICD-10-CM | POA: Diagnosis not present

## 2016-08-26 DIAGNOSIS — L738 Other specified follicular disorders: Secondary | ICD-10-CM | POA: Diagnosis not present

## 2016-08-26 DIAGNOSIS — H0265 Xanthelasma of left lower eyelid: Secondary | ICD-10-CM | POA: Diagnosis not present

## 2016-08-26 DIAGNOSIS — L820 Inflamed seborrheic keratosis: Secondary | ICD-10-CM | POA: Diagnosis not present

## 2016-10-28 ENCOUNTER — Other Ambulatory Visit: Payer: Self-pay | Admitting: General Surgery

## 2016-10-28 DIAGNOSIS — K7689 Other specified diseases of liver: Secondary | ICD-10-CM

## 2016-11-14 ENCOUNTER — Ambulatory Visit
Admission: RE | Admit: 2016-11-14 | Discharge: 2016-11-14 | Disposition: A | Discharge status: Home / Self Care | Payer: Medicare Other | Source: Ambulatory Visit | Attending: General Surgery | Admitting: General Surgery

## 2016-11-14 DIAGNOSIS — K7689 Other specified diseases of liver: Secondary | ICD-10-CM

## 2016-11-14 MED ORDER — GADOBENATE DIMEGLUMINE 529 MG/ML IV SOLN
11.0000 mL | Freq: Once | INTRAVENOUS | Status: AC | PRN
  Administered 2016-11-14: 11 mL via INTRAVENOUS

## 2016-11-20 NOTE — Progress Notes (Signed)
Please let patient know cyst has continued to get smaller.  Does not need repeat imaging unless symptomatic.

## 2016-11-25 DIAGNOSIS — M25541 Pain in joints of right hand: Secondary | ICD-10-CM | POA: Diagnosis not present

## 2016-11-25 DIAGNOSIS — Z8261 Family history of arthritis: Secondary | ICD-10-CM | POA: Diagnosis not present

## 2016-11-25 DIAGNOSIS — M79641 Pain in right hand: Secondary | ICD-10-CM | POA: Diagnosis not present

## 2016-11-25 DIAGNOSIS — M25551 Pain in right hip: Secondary | ICD-10-CM | POA: Diagnosis not present

## 2016-12-29 DIAGNOSIS — Z23 Encounter for immunization: Secondary | ICD-10-CM | POA: Diagnosis not present

## 2017-01-27 ENCOUNTER — Other Ambulatory Visit: Payer: Self-pay | Admitting: Obstetrics and Gynecology

## 2017-01-27 DIAGNOSIS — Z1231 Encounter for screening mammogram for malignant neoplasm of breast: Secondary | ICD-10-CM

## 2017-03-16 ENCOUNTER — Ambulatory Visit: Payer: Medicare Other

## 2017-03-18 ENCOUNTER — Ambulatory Visit (INDEPENDENT_AMBULATORY_CARE_PROVIDER_SITE_OTHER): Payer: Medicare Other | Admitting: Obstetrics and Gynecology

## 2017-03-18 ENCOUNTER — Encounter: Payer: Self-pay | Admitting: Obstetrics and Gynecology

## 2017-03-18 ENCOUNTER — Other Ambulatory Visit (HOSPITAL_COMMUNITY)
Admission: RE | Admit: 2017-03-18 | Discharge: 2017-03-18 | Disposition: A | Discharge status: Home / Self Care | Payer: Medicare Other | Source: Ambulatory Visit | Attending: Obstetrics and Gynecology | Admitting: Obstetrics and Gynecology

## 2017-03-18 ENCOUNTER — Other Ambulatory Visit: Payer: Self-pay

## 2017-03-18 VITALS — BP 110/70 | HR 70 | Resp 14 | Ht 60.5 in | Wt 132.4 lb

## 2017-03-18 DIAGNOSIS — Z01419 Encounter for gynecological examination (general) (routine) without abnormal findings: Secondary | ICD-10-CM | POA: Diagnosis not present

## 2017-03-18 DIAGNOSIS — Z124 Encounter for screening for malignant neoplasm of cervix: Secondary | ICD-10-CM

## 2017-03-18 NOTE — Progress Notes (Signed)
66 y.o. G30P2002 Married Caucasian female here for annual exam.    Had a follow up MRI of liver, and hemorrhagic cyst continues to shrink.  Triglycerides are elevated.  Has high HDL.  Doing diet and exercise.   Good bladder control.   No vaginal bleeding or spotting.   Moved into a new home that they built.  PCP:  Rikki Spearing, DO  Patient's last menstrual period was 04/14/2002.           Sexually active: No.  The current method of family planning is post menopausal status.    Exercising: Yes.    Water aerobics, strength training and walking Smoker:  no  Health Maintenance: Pap: 02-23-15 Neg:Neg HR HPV History of abnormal Pap:  no MMG:  03-13-16 Density B/Neg/BiRads1:[facility]--Has Appt. 03-31-17  Colonoscopy: 02/2015 polyps with Dr.Outlaw([facility]);next due 02/2020. BMD: 03-28-16   Result :Osteopenia of hip and spine:TBC TDaP: PCP Gardasil:   no HIV: 20 years ago Neg Hep C: 2017 Neg Screening Labs:  Hb today: PCP, Urine today: not done   reports that  has never smoked. she has never used smokeless tobacco. She reports that she drinks about 4.2 oz of alcohol per week. She reports that she does not use drugs.  Past Medical History:  Diagnosis Date  . Asthma   . Benign liver cyst    hx of benign liver cyst--Dr.Outlaw  . Cervical cancer ([place])   . 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 Endometriosis   . H/O colonoscopy 2012  . Hyperlipidemia   . Osteopenia 2017   Hip and spine  . Tubular adenoma of colon   . Urinary incontinence     Past Surgical History:  Procedure Laterality Date  . midurethral sling  2008   double right ureter  . VARICOSE VEIN SURGERY Left 12/2011   left left    Current Outpatient Medications  Medication Sig Dispense Refill  . aspirin 81 MG tablet Take 81 mg by mouth daily.    . Cholecalciferol (VITAMIN D-3) 1000 units CAPS Take 1,000 Units by mouth daily.    . fluticasone (FLONASE) 50 MCG/ACT nasal spray Place 1 spray into both nostrils daily.     . Multiple  Vitamin (MULTIVITAMIN) tablet Take 1 tablet by mouth daily.    Marland Kitchen nystatin-triamcinolone (MYCOLOG II) cream Apply 1 application topically 2 (two) times daily. Apply to affected area BID for up to 7 days.  Use prn. 60 g 0  . Omega-3 Fatty Acids (FISH OIL) 1200 MG CAPS Take by mouth daily.    Marland Kitchen Phytosterol Esters (PHYTOSTEROL COMPLEX PO) Take 937 mg by mouth 2 (two) times daily.    . Probiotic CAPS Take by mouth daily.     No current facility-administered medications for this visit.     Family History  Problem Relation Age of Onset  . Hypertension Mother   . Rheum arthritis Father   . Congestive Heart Failure Father     ROS:  Pertinent items are noted in HPI.  Otherwise, a comprehensive ROS was negative.  Exam:   BP 110/70 (BP Location: Right Arm, Patient Position: Sitting, Cuff Size: Normal)   Pulse 70   Resp 14   Ht 5' 0.5" (1.537 m)   Wt 132 lb 6.4 oz (60.1 kg)   LMP 04/14/2002   BMI 25.43 kg/m     General appearance: alert, cooperative and appears stated age Head: Normocephalic, without obvious abnormality, atraumatic Neck: no adenopathy, supple, symmetrical, trachea midline and thyroid normal to  inspection and palpation Lungs: clear to auscultation bilaterally Breasts: normal appearance, no masses or tenderness, No nipple retraction or dimpling, No nipple discharge or bleeding, No axillary or supraclavicular adenopathy Heart: regular rate and rhythm Abdomen: soft, non-tender; no masses, no organomegaly Extremities: extremities normal, atraumatic, no cyanosis or edema Skin: Skin color, texture, turgor normal. No rashes or lesions Lymph nodes: Cervical, supraclavicular, and axillary nodes normal. No abnormal inguinal nodes palpated Neurologic: Grossly normal  Pelvic: External genitalia:  no lesions              Urethra:  normal appearing urethra with no masses, tenderness or lesions              Bartholins and Skenes: normal                 Vagina: normal appearing vagina  with normal color and discharge, no lesions              Cervix: no lesions              Pap taken: Yes.   Bimanual Exam:  Uterus:  normal size, contour, position, consistency, mobility, non-tender              Adnexa: no mass, fullness, tenderness              Rectal exam: Yes.  .  Confirms.              Anus:  normal sphincter tone, no lesions  Chaperone was present for exam.  Assessment:   Well woman visit with normal exam. Probable small fibroid on CT scan.  Hx midurethral sling.  Osteopenia.   Plan: Mammogram screening discussed. Recommended self breast awareness. Pap and HR HPV as above. Guidelines for Calcium, Vitamin D, regular exercise program including cardiovascular and weight bearing exercise. BMD next year. Labs with PCP.    Follow up annually and prn.   After visit summary provided.

## 2017-03-18 NOTE — Patient Instructions (Signed)

## 2017-03-19 LAB — CYTOLOGY - PAP: DIAGNOSIS: NEGATIVE

## 2017-03-31 ENCOUNTER — Ambulatory Visit
Admission: RE | Admit: 2017-03-31 | Discharge: 2017-03-31 | Disposition: A | Discharge status: Home / Self Care | Payer: Medicare Other | Source: Ambulatory Visit | Attending: Obstetrics and Gynecology | Admitting: Obstetrics and Gynecology

## 2017-03-31 DIAGNOSIS — Z1231 Encounter for screening mammogram for malignant neoplasm of breast: Secondary | ICD-10-CM

## 2017-03-31 DIAGNOSIS — H1013 Acute atopic conjunctivitis, bilateral: Secondary | ICD-10-CM | POA: Diagnosis not present

## 2017-05-01 DIAGNOSIS — J069 Acute upper respiratory infection, unspecified: Secondary | ICD-10-CM | POA: Diagnosis not present

## 2017-05-01 DIAGNOSIS — J0100 Acute maxillary sinusitis, unspecified: Secondary | ICD-10-CM | POA: Diagnosis not present

## 2017-06-26 DIAGNOSIS — Z79899 Other long term (current) drug therapy: Secondary | ICD-10-CM | POA: Diagnosis not present

## 2017-06-26 DIAGNOSIS — E785 Hyperlipidemia, unspecified: Secondary | ICD-10-CM | POA: Diagnosis not present

## 2017-06-26 DIAGNOSIS — F419 Anxiety disorder, unspecified: Secondary | ICD-10-CM | POA: Diagnosis not present

## 2017-06-26 DIAGNOSIS — Z0000 Encounter for general adult medical examination without abnormal findings: Secondary | ICD-10-CM | POA: Diagnosis not present

## 2017-06-26 DIAGNOSIS — J302 Other seasonal allergic rhinitis: Secondary | ICD-10-CM | POA: Diagnosis not present

## 2017-06-26 DIAGNOSIS — M79641 Pain in right hand: Secondary | ICD-10-CM | POA: Diagnosis not present

## 2017-07-20 DIAGNOSIS — D3132 Benign neoplasm of left choroid: Secondary | ICD-10-CM | POA: Diagnosis not present

## 2017-07-20 DIAGNOSIS — H2513 Age-related nuclear cataract, bilateral: Secondary | ICD-10-CM | POA: Diagnosis not present

## 2017-07-20 DIAGNOSIS — H5203 Hypermetropia, bilateral: Secondary | ICD-10-CM | POA: Diagnosis not present

## 2017-08-14 DIAGNOSIS — D3132 Benign neoplasm of left choroid: Secondary | ICD-10-CM | POA: Diagnosis not present

## 2017-08-21 DIAGNOSIS — J0100 Acute maxillary sinusitis, unspecified: Secondary | ICD-10-CM | POA: Diagnosis not present

## 2017-08-26 DIAGNOSIS — D1801 Hemangioma of skin and subcutaneous tissue: Secondary | ICD-10-CM | POA: Diagnosis not present

## 2017-08-26 DIAGNOSIS — D225 Melanocytic nevi of trunk: Secondary | ICD-10-CM | POA: Diagnosis not present

## 2017-08-26 DIAGNOSIS — L821 Other seborrheic keratosis: Secondary | ICD-10-CM | POA: Diagnosis not present

## 2017-08-26 DIAGNOSIS — D2271 Melanocytic nevi of right lower limb, including hip: Secondary | ICD-10-CM | POA: Diagnosis not present

## 2017-08-26 DIAGNOSIS — L570 Actinic keratosis: Secondary | ICD-10-CM | POA: Diagnosis not present

## 2017-08-26 DIAGNOSIS — D2262 Melanocytic nevi of left upper limb, including shoulder: Secondary | ICD-10-CM | POA: Diagnosis not present

## 2017-08-28 DIAGNOSIS — M65331 Trigger finger, right middle finger: Secondary | ICD-10-CM | POA: Diagnosis not present

## 2017-08-28 DIAGNOSIS — M65341 Trigger finger, right ring finger: Secondary | ICD-10-CM | POA: Diagnosis not present

## 2017-08-28 DIAGNOSIS — M79641 Pain in right hand: Secondary | ICD-10-CM | POA: Diagnosis not present

## 2017-09-24 DIAGNOSIS — M65331 Trigger finger, right middle finger: Secondary | ICD-10-CM | POA: Diagnosis not present

## 2017-09-24 DIAGNOSIS — M79641 Pain in right hand: Secondary | ICD-10-CM | POA: Diagnosis not present

## 2018-01-01 DIAGNOSIS — Z23 Encounter for immunization: Secondary | ICD-10-CM | POA: Diagnosis not present

## 2018-02-15 ENCOUNTER — Telehealth: Payer: Self-pay | Admitting: Obstetrics and Gynecology

## 2018-02-15 DIAGNOSIS — Z1231 Encounter for screening mammogram for malignant neoplasm of breast: Secondary | ICD-10-CM

## 2018-02-15 DIAGNOSIS — Z780 Asymptomatic menopausal state: Secondary | ICD-10-CM

## 2018-02-15 DIAGNOSIS — Z1239 Encounter for other screening for malignant neoplasm of breast: Secondary | ICD-10-CM

## 2018-02-15 NOTE — Telephone Encounter (Signed)
Patient called stating that Dr. Quincy Simmonds told her to call to remind her to put in orders for bone density and mammogram.

## 2018-02-15 NOTE — Telephone Encounter (Signed)
Spoke with patient and advised orders placed.  She will contact patient and schedule her screenings.  Encounter to Dr. Quincy Simmonds and will close.

## 2018-03-15 IMAGING — US US ASPIRATION
1 series · 7 of 7 positions shown · non-contrast
Comparison: Abdominal MRI - 05/29/2015 (images only, no report);

INDICATION: History of indeterminate though potentially symptomatic complex
cystic lesion within the subcapsular aspect of the posterior segment
of the right lobe of the liver. Please perform ultrasound-guided
aspiration for tissue diagnostic purposes

EXAM:
ULTRASOUND GUIDED LIVER LESION BIOPSY

[Series 1: us aspiration · 0.20mm/px · 7 of 7 slices shown]
[im 1/7]
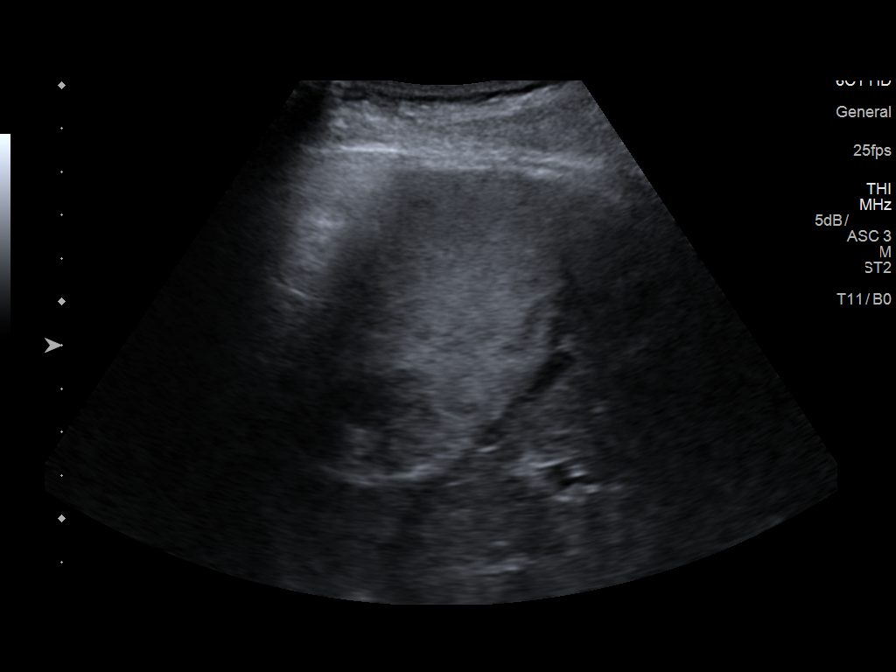
[im 2/7]
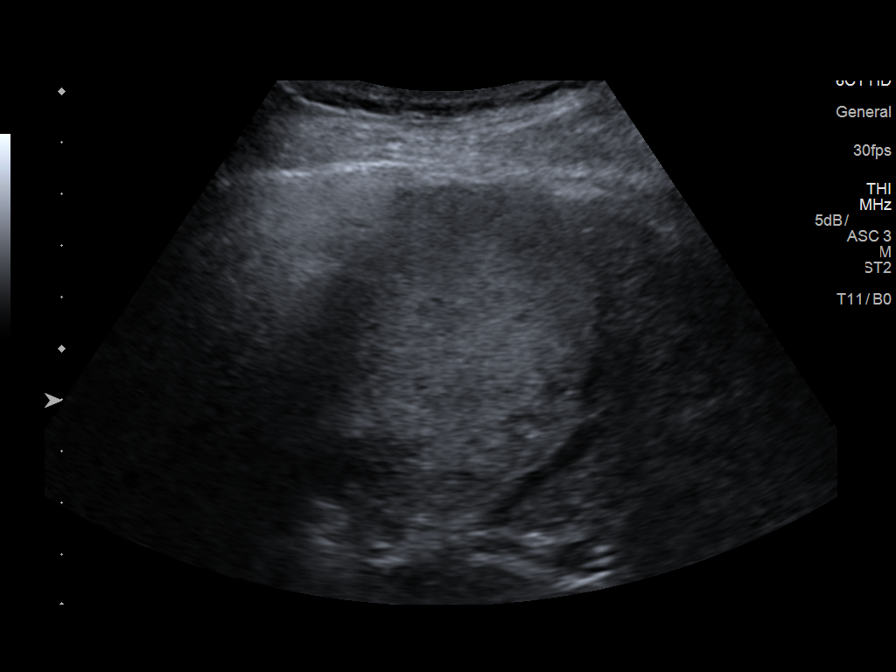
[im 3/7]
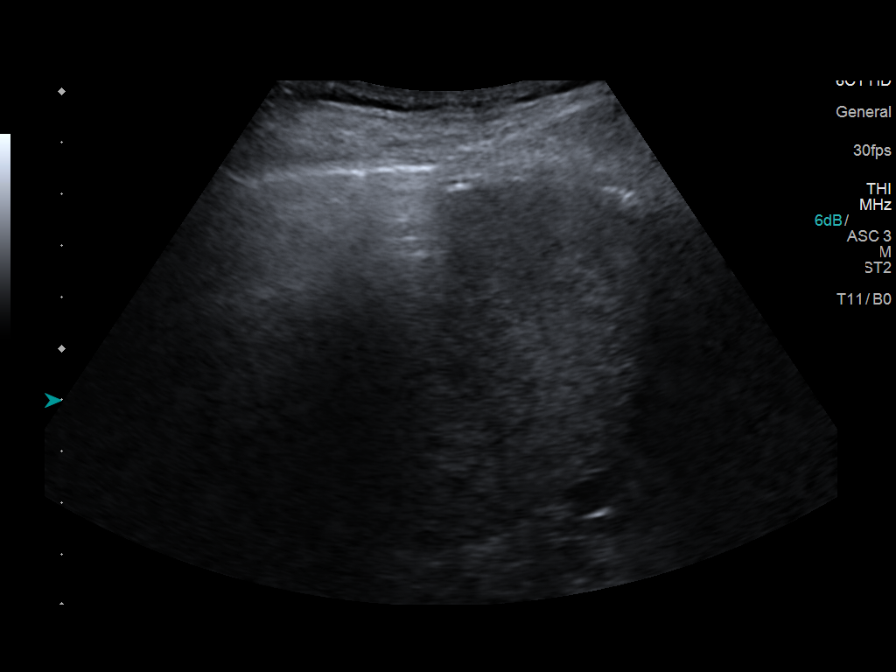
[im 4/7]
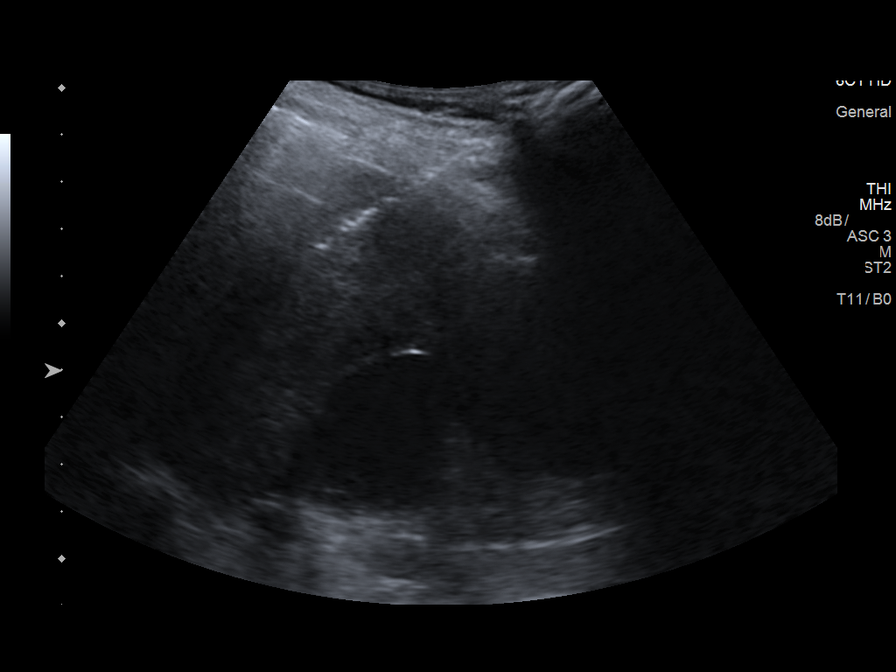
[im 5/7]
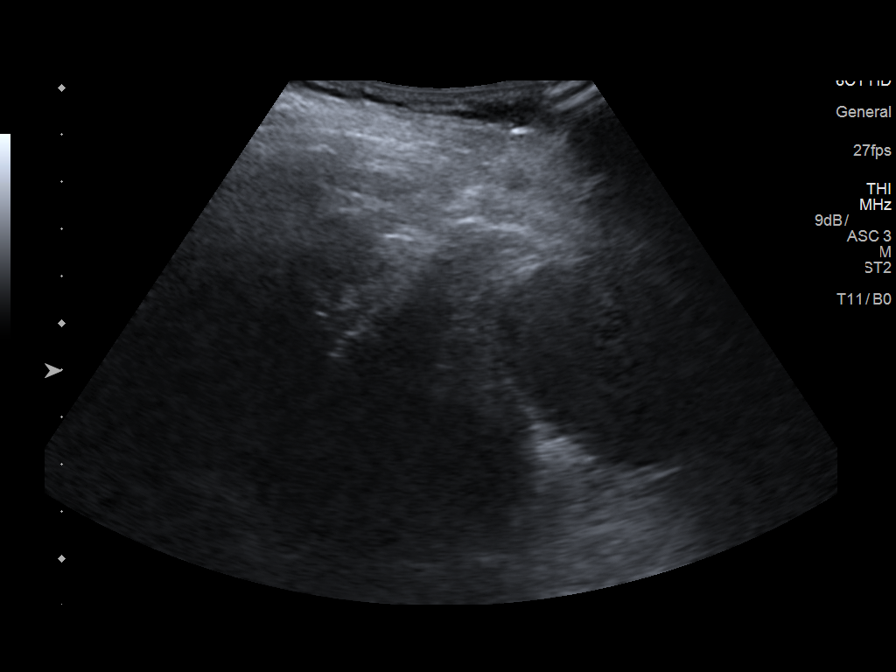
[im 6/7]
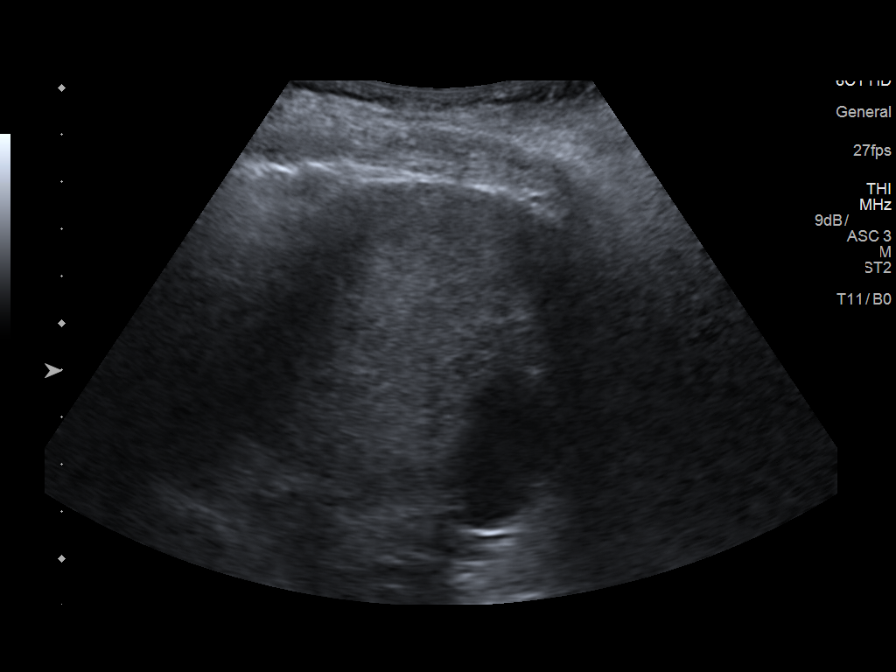
[im 7/7]
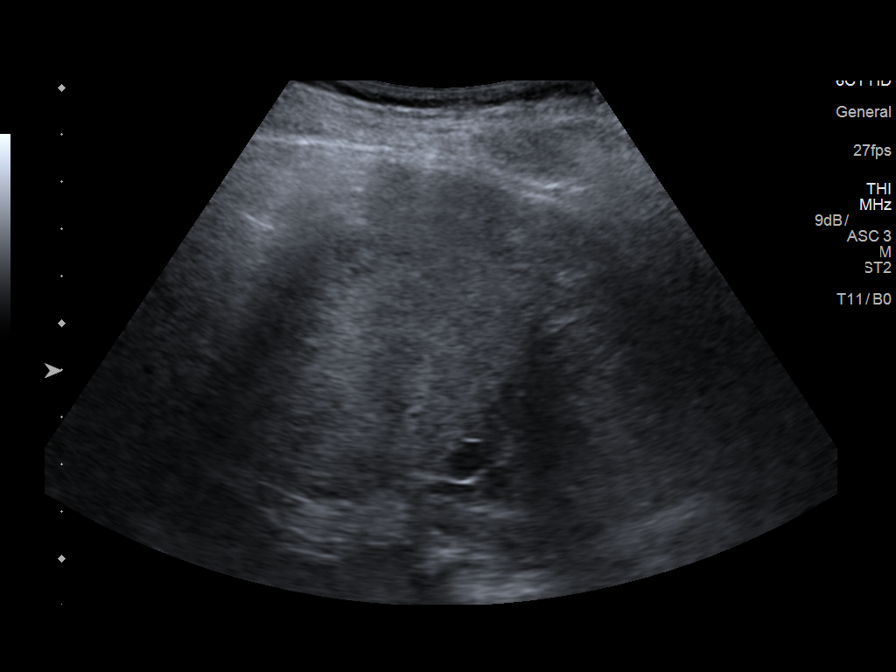

[7 of 7 positions shown; findings below may reference images not displayed]

CT
abdomen pelvis - 05/16/2014

MEDICATIONS:
None

ANESTHESIA/SEDATION:
Fentanyl 50 mcg IV; Versed 1 mg IV

Total Moderate Sedation time: 11 minutes.

The patient's level of consciousness and vital signs were monitored
continuously by radiology nursing throughout the procedure under my
direct supervision.

COMPLICATIONS:
None immediate.

PROCEDURE:
Informed written consent was obtained from the patient after a
discussion of the risks, benefits and alternatives to treatment. The
patient understands and consents the procedure. A timeout was
performed prior to the initiation of the procedure.

Ultrasound scanning was performed of the right upper abdominal
quadrant demonstrates a mixed echogenic ill-defined approximately
8.2 x 5.4 cm lesion within the dome of the right lobe of the liver
(image 2) correlating with the complex cystic lesion/mass seen on
preceding cross-sectional imaging. The procedure was planned.

The right upper abdominal quadrant was prepped and draped in the
usual sterile fashion. The overlying soft tissues were anesthetized
with 1% lidocaine with epinephrine. Tono Haro needle was
advanced into the hepatic lesion and approximately 5 cc of very
viscous dark red/brown fluid was aspirated. Given the marked basis
nature of the contents, additional fluid could not be aspirated.

As such, the Princessnana Cabalona needle was advanced into a separate
component of the complex cystic lesion an additional 5 cc of very
viscous dark red/brown fluid was aspirated. Again, given the marked
viscous nature of the content of the complex cystic lesion,
additional fluid could not be aspirated.

All aspirated fluid was capped and sent to the laboratory for
cytologic analysis.

Superficial hemostasis was obtained with manual compression. Post
procedural scanning was negative for definitive area of hemorrhage
or additional complication. A dressing was placed. The patient
tolerated the procedure well without immediate post procedural
complication.
IMPRESSION: Technically successful ultrasound guided aspiration of a total of 10
cc of very viscous, dark red/brown fluid from the complex cystic
lesion/mass within the posterior segment of the right lobe of the
liver.

While this lesion remains grossly stable since the 05/16/2014
examination, as it remains indeterminate on imaging with
considerations including (but not limited to) atypical hemangioma
versus biliary cystadenoma, if cytologic diagnosis proves
inconclusive, referral to hepatobiliary surgery should be considered
as indicated.

Above findings discussed with referring gastroenterologist, Dr.
Liu, at the completion of the procedure.

## 2018-03-25 NOTE — Progress Notes (Signed)
67 y.o. G68P2002 Married Caucasian female here for annual exam.    States her midurethral sling is working well to control her bladder function.   Labs with PCP in March.   Has a vulvar rash if going to the pool often.  Used Mycolog II last 3 weeks ago.  She has a full tube.   PCP:  Christen Butter, MD  Patient's last menstrual period was 04/14/2002.           Sexually active: No.  The current method of family planning is post menopausal status.    Exercising: Yes.    water aerobics, strength, bowling,and walking Smoker:  no  Health Maintenance: Pap: 03-18-17 Neg, 02-23-15 Neg:Neg HR HPV History of abnormal Pap:  no MMG:  03-31-17 Neg/density B/BiRads1--Appt. 03-31-18 Colonoscopy:02/2015 polyps; next due 02/2020 BMD: 03-28-16  Result :Osteopenia of hip and spine.  Scheduled for Jan. 2020.  TDaP:  PCP.  Thinks she is due next year.  Gardasil:   no HIV:Neg 20 years ago Hep C: Neg 2017 Screening Labs:  Hb today: PCP Had Pneumovax vaccine.  Flu vaccine done. Will get Shingrix done.   reports that she has never smoked. She has never used smokeless tobacco. She reports current alcohol use of about 7.0 standard drinks of alcohol per week. She reports that she does not use drugs.  Past Medical History:  Diagnosis Date  . Asthma   . Benign liver cyst    hx of benign liver cyst--Dr.Outlaw  . Cervical cancer ([place])   . 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 Endometriosis   . H/O colonoscopy 2012  . Hyperlipidemia   . Osteopenia 2017   Hip and spine  . Tubular adenoma of colon   . Urinary incontinence     Past Surgical History:  Procedure Laterality Date  . midurethral sling  2008   double right ureter  . VARICOSE VEIN SURGERY Left 12/2011   left left    Current Outpatient Medications  Medication Sig Dispense Refill  . aspirin 81 MG tablet Take 81 mg by mouth daily.    . Cholecalciferol (VITAMIN D-3) 1000 units CAPS Take 1,000 Units by mouth daily.    . fluticasone (FLONASE) 50  MCG/ACT nasal spray Place 1 spray into both nostrils daily.     . Multiple Vitamin (MULTIVITAMIN) tablet Take 1 tablet by mouth daily.    Marland Kitchen nystatin-triamcinolone (MYCOLOG II) cream Apply 1 application topically 2 (two) times daily. Apply to affected area BID for up to 7 days.  Use prn. 60 g 0  . Omega-3 Fatty Acids (FISH OIL) 1200 MG CAPS Take by mouth daily.    Marland Kitchen Phytosterol Esters (PHYTOSTEROL COMPLEX PO) Take 937 mg by mouth 2 (two) times daily.    . Probiotic CAPS Take by mouth daily.     No current facility-administered medications for this visit.     Family History  Problem Relation Age of Onset  . Hypertension Mother   . Rheum arthritis Father   . Congestive Heart Failure Father     Review of Systems  All other systems reviewed and are negative.   Exam:   LMP 04/14/2002     General appearance: alert, cooperative and appears stated age Head: Normocephalic, without obvious abnormality, atraumatic Neck: no adenopathy, supple, symmetrical, trachea midline and thyroid normal to inspection and palpation Lungs: clear to auscultation bilaterally Breasts: normal appearance, no masses or tenderness, No nipple retraction or dimpling, No nipple discharge or bleeding, No axillary or supraclavicular adenopathy  Heart: regular rate and rhythm Abdomen: soft, non-tender; no masses, no organomegaly Extremities: extremities normal, atraumatic, no cyanosis or edema Skin: Skin color, texture, turgor normal. No rashes or lesions Lymph nodes: Cervical, supraclavicular, and axillary nodes normal. No abnormal inguinal nodes palpated Neurologic: Grossly normal  Pelvic: External genitalia:  no lesions              Urethra:  normal appearing urethra with no masses, tenderness or lesions              Bartholins and Skenes: normal                 Vagina: normal appearing vagina with normal color and discharge, no lesions              Cervix: no lesions              Pap taken: No. Bimanual Exam:   Uterus:  normal size, contour, position, consistency, mobility, non-tender              Adnexa: no mass, fullness, tenderness              Rectal exam: Yes.  .  Confirms.              Anus:  normal sphincter tone, no lesions  Chaperone was present for exam.  Assessment:   Well woman visit with normal exam. Probable small fibroid on CT scan.  Hx midurethral sling.  Osteopenia.  Hx vulvitis.   Plan: Mammogram screening. Recommended self breast awareness. Pap and HR HPV as above. Guidelines for Calcium, Vitamin D, regular exercise program including cardiovascular and weight bearing exercise. She will call if she needs a refill of Mycolog II.   BMD in Jan. 2020.  Follow up annually and prn.   After visit summary provided.

## 2018-03-26 ENCOUNTER — Encounter: Payer: Self-pay | Admitting: Obstetrics and Gynecology

## 2018-03-26 ENCOUNTER — Other Ambulatory Visit: Payer: Self-pay

## 2018-03-26 ENCOUNTER — Ambulatory Visit (INDEPENDENT_AMBULATORY_CARE_PROVIDER_SITE_OTHER): Payer: Medicare Other | Admitting: Obstetrics and Gynecology

## 2018-03-26 VITALS — BP 122/74 | HR 70 | Resp 16 | Ht 61.0 in | Wt 139.2 lb

## 2018-03-26 DIAGNOSIS — Z124 Encounter for screening for malignant neoplasm of cervix: Secondary | ICD-10-CM | POA: Diagnosis not present

## 2018-03-26 DIAGNOSIS — Z01419 Encounter for gynecological examination (general) (routine) without abnormal findings: Secondary | ICD-10-CM | POA: Diagnosis not present

## 2018-03-26 NOTE — Patient Instructions (Signed)

## 2018-03-31 ENCOUNTER — Ambulatory Visit
Admission: RE | Admit: 2018-03-31 | Discharge: 2018-03-31 | Disposition: A | Discharge status: Home / Self Care | Payer: Medicare Other | Source: Ambulatory Visit | Attending: Obstetrics and Gynecology | Admitting: Obstetrics and Gynecology

## 2018-03-31 DIAGNOSIS — Z1231 Encounter for screening mammogram for malignant neoplasm of breast: Secondary | ICD-10-CM

## 2018-03-31 DIAGNOSIS — Z1239 Encounter for other screening for malignant neoplasm of breast: Secondary | ICD-10-CM

## 2018-04-19 ENCOUNTER — Ambulatory Visit
Admission: RE | Admit: 2018-04-19 | Discharge: 2018-04-19 | Disposition: A | Discharge status: Home / Self Care | Payer: Medicare Other | Source: Ambulatory Visit | Attending: Obstetrics and Gynecology | Admitting: Obstetrics and Gynecology

## 2018-04-19 DIAGNOSIS — M8589 Other specified disorders of bone density and structure, multiple sites: Secondary | ICD-10-CM | POA: Diagnosis not present

## 2018-04-19 DIAGNOSIS — Z780 Asymptomatic menopausal state: Secondary | ICD-10-CM | POA: Diagnosis not present

## 2018-04-20 ENCOUNTER — Encounter: Payer: Self-pay | Admitting: Obstetrics and Gynecology

## 2018-05-17 DIAGNOSIS — M7701 Medial epicondylitis, right elbow: Secondary | ICD-10-CM | POA: Diagnosis not present

## 2018-05-17 DIAGNOSIS — M19241 Secondary osteoarthritis, right hand: Secondary | ICD-10-CM | POA: Diagnosis not present

## 2018-05-26 DIAGNOSIS — M7701 Medial epicondylitis, right elbow: Secondary | ICD-10-CM | POA: Diagnosis not present

## 2018-05-28 DIAGNOSIS — M7701 Medial epicondylitis, right elbow: Secondary | ICD-10-CM | POA: Diagnosis not present

## 2018-06-01 DIAGNOSIS — M7701 Medial epicondylitis, right elbow: Secondary | ICD-10-CM | POA: Diagnosis not present

## 2018-06-03 DIAGNOSIS — M7701 Medial epicondylitis, right elbow: Secondary | ICD-10-CM | POA: Diagnosis not present

## 2018-06-08 DIAGNOSIS — M7701 Medial epicondylitis, right elbow: Secondary | ICD-10-CM | POA: Diagnosis not present

## 2018-06-10 DIAGNOSIS — M7701 Medial epicondylitis, right elbow: Secondary | ICD-10-CM | POA: Diagnosis not present

## 2018-06-15 DIAGNOSIS — M7701 Medial epicondylitis, right elbow: Secondary | ICD-10-CM | POA: Diagnosis not present

## 2018-06-17 DIAGNOSIS — M7701 Medial epicondylitis, right elbow: Secondary | ICD-10-CM | POA: Diagnosis not present

## 2018-06-22 DIAGNOSIS — M7701 Medial epicondylitis, right elbow: Secondary | ICD-10-CM | POA: Diagnosis not present

## 2018-06-24 DIAGNOSIS — M7701 Medial epicondylitis, right elbow: Secondary | ICD-10-CM | POA: Diagnosis not present

## 2018-06-29 DIAGNOSIS — Z0000 Encounter for general adult medical examination without abnormal findings: Secondary | ICD-10-CM | POA: Diagnosis not present

## 2018-06-29 DIAGNOSIS — M7701 Medial epicondylitis, right elbow: Secondary | ICD-10-CM | POA: Diagnosis not present

## 2018-06-29 DIAGNOSIS — E7801 Familial hypercholesterolemia: Secondary | ICD-10-CM | POA: Diagnosis not present

## 2018-07-22 DIAGNOSIS — D72819 Decreased white blood cell count, unspecified: Secondary | ICD-10-CM | POA: Diagnosis not present

## 2018-07-22 DIAGNOSIS — D649 Anemia, unspecified: Secondary | ICD-10-CM | POA: Diagnosis not present

## 2018-10-04 DIAGNOSIS — H04129 Dry eye syndrome of unspecified lacrimal gland: Secondary | ICD-10-CM | POA: Diagnosis not present

## 2018-10-04 DIAGNOSIS — D3132 Benign neoplasm of left choroid: Secondary | ICD-10-CM | POA: Diagnosis not present

## 2018-10-04 DIAGNOSIS — H2513 Age-related nuclear cataract, bilateral: Secondary | ICD-10-CM | POA: Diagnosis not present

## 2018-10-04 DIAGNOSIS — H5203 Hypermetropia, bilateral: Secondary | ICD-10-CM | POA: Diagnosis not present

## 2018-10-26 DIAGNOSIS — L820 Inflamed seborrheic keratosis: Secondary | ICD-10-CM | POA: Diagnosis not present

## 2018-10-26 DIAGNOSIS — L738 Other specified follicular disorders: Secondary | ICD-10-CM | POA: Diagnosis not present

## 2018-10-26 DIAGNOSIS — D225 Melanocytic nevi of trunk: Secondary | ICD-10-CM | POA: Diagnosis not present

## 2018-10-26 DIAGNOSIS — L821 Other seborrheic keratosis: Secondary | ICD-10-CM | POA: Diagnosis not present

## 2018-10-26 DIAGNOSIS — L308 Other specified dermatitis: Secondary | ICD-10-CM | POA: Diagnosis not present

## 2018-10-26 DIAGNOSIS — L918 Other hypertrophic disorders of the skin: Secondary | ICD-10-CM | POA: Diagnosis not present

## 2018-10-26 DIAGNOSIS — L720 Epidermal cyst: Secondary | ICD-10-CM | POA: Diagnosis not present

## 2018-10-26 DIAGNOSIS — D2271 Melanocytic nevi of right lower limb, including hip: Secondary | ICD-10-CM | POA: Diagnosis not present

## 2018-12-07 ENCOUNTER — Other Ambulatory Visit: Payer: Self-pay | Admitting: Obstetrics and Gynecology

## 2018-12-07 DIAGNOSIS — Z1231 Encounter for screening mammogram for malignant neoplasm of breast: Secondary | ICD-10-CM

## 2018-12-17 DIAGNOSIS — E7801 Familial hypercholesterolemia: Secondary | ICD-10-CM | POA: Diagnosis not present

## 2018-12-17 DIAGNOSIS — Z23 Encounter for immunization: Secondary | ICD-10-CM | POA: Diagnosis not present

## 2019-02-08 DIAGNOSIS — D3132 Benign neoplasm of left choroid: Secondary | ICD-10-CM | POA: Diagnosis not present

## 2019-02-08 DIAGNOSIS — H3540 Unspecified peripheral retinal degeneration: Secondary | ICD-10-CM | POA: Diagnosis not present

## 2019-02-08 DIAGNOSIS — H35361 Drusen (degenerative) of macula, right eye: Secondary | ICD-10-CM | POA: Diagnosis not present

## 2019-03-24 ENCOUNTER — Other Ambulatory Visit: Payer: Self-pay

## 2019-03-24 ENCOUNTER — Telehealth: Payer: Self-pay | Admitting: *Deleted

## 2019-03-24 ENCOUNTER — Telehealth: Payer: Self-pay

## 2019-03-24 ENCOUNTER — Telehealth (INDEPENDENT_AMBULATORY_CARE_PROVIDER_SITE_OTHER): Payer: Medicare Other | Admitting: Internal Medicine

## 2019-03-24 VITALS — BP 124/86 | HR 67 | Ht 61.5 in | Wt 132.0 lb

## 2019-03-24 DIAGNOSIS — E785 Hyperlipidemia, unspecified: Secondary | ICD-10-CM | POA: Diagnosis not present

## 2019-03-24 NOTE — Patient Instructions (Signed)
Medication Instructions:  No changes *If you need a refill on your cardiac medications before your next appointment, please call your pharmacy*  Lab Work: none If you have labs (blood work) drawn today and your tests are completely normal, you will receive your results only by: Marland Kitchen MyChart Message (if you have MyChart) OR . A paper copy in the mail If you have any lab test that is abnormal or we need to change your treatment, we will call you to review the results.  Testing/Procedures: Dr. Harrington Challenger recommends a calcium score ct scan.  We will contact you to schedule this.  Follow-Up: Follow up with your physician will depend on test results.  Other Instructions

## 2019-03-24 NOTE — Progress Notes (Signed)
Virtual Visit via Video Note   This visit type was conducted due to national recommendations for restrictions regarding the COVID-19 Pandemic (e.g. social distancing) in an effort to limit this patient's exposure and mitigate transmission in our community.  Due to her co-morbid illnesses, this patient is at least at moderate risk for complications without adequate follow up.  This format is felt to be most appropriate for this patient at this time.  All issues noted in this document were discussed and addressed.  A limited physical exam was performed with this format.  Please refer to the patient's chart for her consent to telehealth for [facility].   Date:  03/24/2019   ID:  Amber Escobar, DOB Jan 20, 1951, MRN [id number]  Patient Location: Home Provider Location: Office  PCP:  Glenford Bayley, DO  Cardiologist:  New   Evaluation Performed:  New Patient Evaluation  Chief Complaint:  Pt referred for eval of hyperlipidemia    History of Present Illness:    Amber Escobar is a 68 y.o. female who I saw in 2017 for palpitations  She was also seen by Dr Rayford Halsted for hyperlipidemia   She brings in a list of readings  Since 1997   LDL then was 137   There was one other reading like this.   Otherwise readings were 170s to 210s    Mother had CVA at 70      Pt says her breathing is OK  No palpitations   No CP    Walking a lot    Cant go to water aerobics   Works in yard  Mows grass    The patient does not have symptoms concerning for COVID-19 infection (fever, chills, cough, or new shortness of breath).    Past Medical History:  Diagnosis Date  . Asthma   . Benign liver cyst    hx of benign liver cyst--Dr.Outlaw  . Cervical cancer ([place])   . Choroidal nevus of left eye   . 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 Endometriosis   . H/O colonoscopy 2012  . Hyperlipidemia   . Osteopenia 2017   Hip and spine  . Tubular adenoma of colon   . Urinary incontinence    Past Surgical History:   Procedure Laterality Date  . ENDOCERVICAL ABLATION    . midurethral sling  2008   double right ureter  . VARICOSE VEIN SURGERY Left 12/2011   left left     Current Meds  Medication Sig  . ALPRAZolam (XANAX) 0.25 MG tablet Take 1 tablet by mouth as needed.  Marland Kitchen aspirin 81 MG tablet Take 81 mg by mouth daily.  . Cholecalciferol (VITAMIN D-3) 1000 units CAPS Take 1,000 Units by mouth daily.  . Multiple Vitamin (MULTIVITAMIN) tablet Take 1 tablet by mouth daily.  Marland Kitchen nystatin-triamcinolone (MYCOLOG II) cream Apply 1 application topically 2 (two) times daily. Apply to affected area BID for up to 7 days.  Use prn.  . Omega-3 Fatty Acids (FISH OIL) 1200 MG CAPS Take by mouth daily.  Marland Kitchen Phytosterol Esters (PHYTOSTEROL COMPLEX PO) Take 937 mg by mouth 2 (two) times daily.     Allergies:   Patient has no known allergies.   Social History   Tobacco Use  . Smoking status: Never Smoker  . Smokeless tobacco: Never Used  Substance Use Topics  . Alcohol use: Yes    Alcohol/week: 7.0 standard drinks    Types: 7 Glasses of wine per week  . Drug  use: No     Family Hx: The patient's family history includes COPD in her mother; CVA in her maternal grandfather, maternal grandmother, and mother; Cirrhosis in her paternal grandfather; Congestive Heart Failure in her father; Healthy in her brother; Hypertension in her mother; Rheum arthritis in her father and paternal grandmother; Stroke in her mother.  ROS:   Please see the history of present illness.     All other systems reviewed and are negative.   Prior CV studies:   The following studies were reviewed today:   Labs/Other Tests and Data Reviewed:    EKG:  No ECG reviewed.  Recent Labs: No results found for requested labs within last 8760 hours.   Recent Lipid Panel No results found for: CHOL, TRIG, HDL, CHOLHDL, LDLCALC, LDLDIRECT  Wt Readings from Last 3 Encounters:  03/24/19 132 lb (59.9 kg)  03/26/18 139 lb 3.2 oz (63.1 kg)   03/18/17 132 lb 6.4 oz (60.1 kg)     Objective:    Vital Signs:  BP 124/86   Pulse 67   Ht 5' 1.5" (1.562 m)   Wt 132 lb (59.9 kg)   LMP 04/14/2002   BMI 24.54 kg/m    VITAL SIGNS:  reviewed  ASSESSMENT & PLAN:    1. HL   Profound   Last readings:  LDL 212  HDL 85   Trig 115  Most of readings are in this range   Suggests familial hyperlipidemia though she has a couple readings that are much lower (even though she says her diet is overall good)   I agree with recommendation for Calcium score CT to define atherosclerotic burden    WIll then review Rx for lipids       COVID-19 Education: The signs and symptoms of COVID-19 were discussed with the patient and how to seek care for testing (follow up with PCP or arrange E-visit).  The importance of social distancing was discussed today.  Time:   Today, I have spent 25 minutes with the patient with telehealth technology discussing the above problems.     Medication Adjustments/Labs and Tests Ordered: Current medicines are reviewed at length with the patient today.  Concerns regarding medicines are outlined above.   Tests Ordered: No orders of the defined types were placed in this encounter.   Medication Changes: No orders of the defined types were placed in this encounter.   Follow Up:  based on test results    Signed, Dorris Carnes, MD  03/24/2019 9:42 AM    [place]

## 2019-03-24 NOTE — Telephone Encounter (Signed)

## 2019-03-24 NOTE — Telephone Encounter (Signed)
Called patient to schedule CT Ca Score ordered by Dr. Harrington Challenger left message to call back to [phone].

## 2019-03-31 ENCOUNTER — Ambulatory Visit: Payer: Medicare Other | Admitting: Obstetrics and Gynecology

## 2019-04-04 ENCOUNTER — Ambulatory Visit: Payer: Medicare Other

## 2019-04-05 ENCOUNTER — Ambulatory Visit (INDEPENDENT_AMBULATORY_CARE_PROVIDER_SITE_OTHER)
Admission: RE | Admit: 2019-04-05 | Discharge: 2019-04-05 | Disposition: A | Discharge status: Home / Self Care | Payer: Medicare Other | Source: Ambulatory Visit | Attending: Internal Medicine | Admitting: Internal Medicine

## 2019-04-05 ENCOUNTER — Other Ambulatory Visit: Payer: Self-pay

## 2019-04-05 DIAGNOSIS — E785 Hyperlipidemia, unspecified: Secondary | ICD-10-CM

## 2019-04-22 ENCOUNTER — Other Ambulatory Visit: Payer: Self-pay | Admitting: *Deleted

## 2019-04-22 DIAGNOSIS — E785 Hyperlipidemia, unspecified: Secondary | ICD-10-CM

## 2019-04-26 ENCOUNTER — Other Ambulatory Visit: Payer: Self-pay

## 2019-04-26 ENCOUNTER — Other Ambulatory Visit: Payer: 59

## 2019-04-26 DIAGNOSIS — E785 Hyperlipidemia, unspecified: Secondary | ICD-10-CM

## 2019-04-26 LAB — LIPID PANEL
Chol/HDL Ratio: 3.2 ratio (ref 0.0–4.4)
Cholesterol, Total: 319 mg/dL — ABNORMAL HIGH (ref 100–199)
HDL: 101 mg/dL (ref >39)
LDL Chol Calc (NIH): 205 mg/dL — ABNORMAL HIGH (ref 0–99)
Triglycerides: 85 mg/dL (ref 0–149)
VLDL Cholesterol Cal: 13 mg/dL (ref 5–40)

## 2019-04-28 ENCOUNTER — Other Ambulatory Visit: Payer: Self-pay | Admitting: *Deleted

## 2019-04-28 DIAGNOSIS — E785 Hyperlipidemia, unspecified: Secondary | ICD-10-CM

## 2019-05-17 ENCOUNTER — Other Ambulatory Visit: Payer: Self-pay

## 2019-05-17 ENCOUNTER — Ambulatory Visit
Admission: RE | Admit: 2019-05-17 | Discharge: 2019-05-17 | Disposition: A | Discharge status: Home / Self Care | Payer: 59 | Source: Ambulatory Visit | Attending: Obstetrics and Gynecology | Admitting: Obstetrics and Gynecology

## 2019-05-17 DIAGNOSIS — Z1231 Encounter for screening mammogram for malignant neoplasm of breast: Secondary | ICD-10-CM | POA: Diagnosis not present

## 2019-05-20 ENCOUNTER — Other Ambulatory Visit: Payer: Self-pay

## 2019-05-23 ENCOUNTER — Ambulatory Visit (INDEPENDENT_AMBULATORY_CARE_PROVIDER_SITE_OTHER): Payer: Medicare Other | Admitting: Obstetrics and Gynecology

## 2019-05-23 ENCOUNTER — Encounter: Payer: Self-pay | Admitting: Obstetrics and Gynecology

## 2019-05-23 ENCOUNTER — Other Ambulatory Visit: Payer: Self-pay

## 2019-05-23 ENCOUNTER — Other Ambulatory Visit (HOSPITAL_COMMUNITY)
Admission: RE | Admit: 2019-05-23 | Discharge: 2019-05-23 | Disposition: A | Discharge status: Home / Self Care | Payer: Medicare Other | Source: Ambulatory Visit | Attending: Obstetrics and Gynecology | Admitting: Obstetrics and Gynecology

## 2019-05-23 VITALS — BP 138/70 | HR 76 | Temp 97.2°F | Resp 12 | Ht 60.75 in | Wt 135.6 lb

## 2019-05-23 DIAGNOSIS — Z780 Asymptomatic menopausal state: Secondary | ICD-10-CM | POA: Diagnosis not present

## 2019-05-23 DIAGNOSIS — Z01419 Encounter for gynecological examination (general) (routine) without abnormal findings: Secondary | ICD-10-CM

## 2019-05-23 MED ORDER — ESTRADIOL 0.1 MG/GM VA CREA: TOPICAL_CREAM | VAGINAL | 1 refills | Status: DC

## 2019-05-23 NOTE — Addendum Note (Signed)
Addended by: Yisroel Ramming, Dietrich Pates E on: 05/23/2019 03:28 PM   Modules accepted: Orders

## 2019-05-23 NOTE — Patient Instructions (Signed)

## 2019-05-23 NOTE — Progress Notes (Signed)
69 y.o. G6P2002 Married Caucasian female here for annual exam.    Bladder doing well.  Denies any vaginal bleeding.   Dealing with her cholesterol control.   Got her first Covid vaccine.   PCP: Crissie Sickles, DO     Patient's last menstrual period was 04/14/2002.           Sexually active: No.  The current method of family planning is post menopausal status.    Exercising: Yes.    walking and cleaning, gardening, mowing  Smoker:  no  Health Maintenance: Pap: 03-18-17 Neg, 02-23-15 Neg:Neg HR HPV History of abnormal Pap:  no MMG: 05-17-19 3D/Neg/density B/BiRads1 Colonoscopy: 02/2015 polyps; next due 02/2020 BMD: 04-19-18  Result:Osteopenia of hip--stable and spine has improved TDaP: PCP Gardasil:   no HIV: Neg ~20 years ago Hep C:Neg 2017 Screening Labs:  PCP.  Flu vaccine:  Completed.    reports that she has never smoked. She has never used smokeless tobacco. She reports current alcohol use of about 7.0 standard drinks of alcohol per week. She reports that she does not use drugs.  Past Medical History:  Diagnosis Date  . Asthma   . Benign liver cyst    hx of benign liver cyst--Dr.Outlaw  . Cervical cancer ([place])   . Choroidal nevus of left eye   . 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 Endometriosis   . H/O colonoscopy 2012  . Hyperlipidemia   . Osteopenia 2017   Hip and spine  . Tubular adenoma of colon   . Urinary incontinence     Past Surgical History:  Procedure Laterality Date  . ENDOCERVICAL ABLATION    . midurethral sling  2008   double right ureter  . VARICOSE VEIN SURGERY Left 12/2011   left left    Current Outpatient Medications  Medication Sig Dispense Refill  . ALPRAZolam (XANAX) 0.25 MG tablet Take 1 tablet by mouth as needed.    Marland Kitchen aspirin 81 MG tablet Take 81 mg by mouth daily.    . Cholecalciferol (VITAMIN D-3) 1000 units CAPS Take 1,000 Units by mouth daily.    . Multiple Vitamin (MULTIVITAMIN) tablet Take 1 tablet by mouth daily.    . Multiple  Vitamins-Minerals (PRESERVISION AREDS 2) CAPS Take 1 tablet by mouth 2 (two) times daily.    Marland Kitchen nystatin-triamcinolone (MYCOLOG II) cream Apply 1 application topically 2 (two) times daily. Apply to affected area BID for up to 7 days.  Use prn. 60 g 0  . Omega-3 Fatty Acids (FISH OIL) 1200 MG CAPS Take by mouth daily.    Marland Kitchen Phytosterol Esters (PHYTOSTEROL COMPLEX PO) Take 937 mg by mouth 2 (two) times daily.     No current facility-administered medications for this visit.    Family History  Problem Relation Age of Onset  . Hypertension Mother   . CVA Mother   . Stroke Mother   . COPD Mother   . Rheum arthritis Father   . Congestive Heart Failure Father   . Healthy Brother   . CVA Maternal Grandmother   . CVA Maternal Grandfather   . Rheum arthritis Paternal Grandmother   . Cirrhosis Paternal Grandfather     Review of Systems  All other systems reviewed and are negative.   Exam:   BP 138/70   Pulse 76   Temp (!) 97.2 F (36.2 C)   Resp 12   Ht 5' 0.75" (1.543 m)   Wt 135 lb 9.6 oz (61.5 kg)   LMP 04/14/2002  BMI 25.83 kg/m     General appearance: alert, cooperative and appears stated age Head: normocephalic, without obvious abnormality, atraumatic Neck: no adenopathy, supple, symmetrical, trachea midline and thyroid normal to inspection and palpation Lungs: clear to auscultation bilaterally Breasts: normal appearance, no masses or tenderness, No nipple retraction or dimpling, No nipple discharge or bleeding, No axillary adenopathy Heart: regular rate and rhythm Abdomen: soft, non-tender; no masses, no organomegaly Extremities: extremities normal, atraumatic, no cyanosis or edema Skin: skin color, texture, turgor normal. No rashes or lesions Lymph nodes: cervical, supraclavicular, and axillary nodes normal. Neurologic: grossly normal  Pelvic: External genitalia:  no lesions              No abnormal inguinal nodes palpated.              Urethra:  normal appearing  urethra with no masses, tenderness or lesions.  No sling exposure.               Bartholins and Skenes: normal                 Vagina: atrophy noted.               Cervix: no lesions              Pap taken: Yes.   Bimanual Exam:  Uterus:  normal size, contour, position, consistency, mobility, non-tender              Adnexa: no mass, fullness, tenderness              Rectal exam: Yes.  .  Confirms.              Anus:  normal sphincter tone, no lesions  Chaperone was present for exam.  Assessment:   Well woman visit with normal exam. Probable small fibroid on CT scan.  Hx midurethral sling.  Osteopenia. Hx vulvitis.  Vaginal atrophy.   Plan: Mammogram screening discussed. Self breast awareness reviewed. Pap and HR HPV as above. Guidelines for Calcium, Vitamin D, regular exercise program including cardiovascular and weight bearing exercise. Rx for Estradiol vaginal cream.  I discussed potential effect on breast cancer.   BMD next year.  Follow up annually and prn.   After visit summary provided.

## 2019-05-24 LAB — CYTOLOGY - PAP: Diagnosis: NEGATIVE

## 2019-07-06 DIAGNOSIS — M8580 Other specified disorders of bone density and structure, unspecified site: Secondary | ICD-10-CM | POA: Diagnosis not present

## 2019-07-06 DIAGNOSIS — E782 Mixed hyperlipidemia: Secondary | ICD-10-CM | POA: Diagnosis not present

## 2019-07-06 DIAGNOSIS — Z23 Encounter for immunization: Secondary | ICD-10-CM | POA: Diagnosis not present

## 2019-07-06 DIAGNOSIS — Z1211 Encounter for screening for malignant neoplasm of colon: Secondary | ICD-10-CM | POA: Diagnosis not present

## 2019-07-06 DIAGNOSIS — Z0000 Encounter for general adult medical examination without abnormal findings: Secondary | ICD-10-CM | POA: Diagnosis not present

## 2019-08-25 DIAGNOSIS — J0111 Acute recurrent frontal sinusitis: Secondary | ICD-10-CM | POA: Diagnosis not present

## 2019-10-28 ENCOUNTER — Other Ambulatory Visit: Payer: Medicare Other | Admitting: *Deleted

## 2019-10-28 ENCOUNTER — Other Ambulatory Visit: Payer: Self-pay

## 2019-10-28 DIAGNOSIS — E785 Hyperlipidemia, unspecified: Secondary | ICD-10-CM

## 2019-10-28 LAB — LIPID PANEL
Chol/HDL Ratio: 3.4 ratio (ref 0.0–4.4)
Cholesterol, Total: 315 mg/dL — ABNORMAL HIGH (ref 100–199)
HDL: 93 mg/dL (ref >39)
LDL Chol Calc (NIH): 207 mg/dL — ABNORMAL HIGH (ref 0–99)
Triglycerides: 93 mg/dL (ref 0–149)
VLDL Cholesterol Cal: 15 mg/dL (ref 5–40)

## 2019-11-09 ENCOUNTER — Telehealth: Payer: Self-pay | Admitting: *Deleted

## 2019-11-09 DIAGNOSIS — E785 Hyperlipidemia, unspecified: Secondary | ICD-10-CM

## 2019-11-09 MED ORDER — ROSUVASTATIN CALCIUM 20 MG PO TABS: 20.0000 mg | ORAL_TABLET | Freq: Every day | ORAL | 11 refills | Status: DC

## 2019-11-09 NOTE — Telephone Encounter (Signed)
Patient returned my phone call. Discussed results and recommendations from Dr. Harrington Challenger.  She will start Crestor 20 mg daily and has scheduled f/u lab appointment for lipids/ast/ck  She has 2 questions/concerns: --is it safe to continue otc phytosterols that her previous cardiologist recommended 8 years ago to help triglycerides? --is Crestor the best/safest statin for her liver? She has a liver cyst which has shrunk over the years but wants to be extra careful because of this.  Aware I will forward to PharmD for further recommendations and then let her know.

## 2019-11-09 NOTE — Telephone Encounter (Signed)
1) Yes she can continue phytosterols although they have not shown to be effective for lowering triglycerides.  They may be helpful in helping her lower LDL along with Crestor. 2) Crestor is the most effective statin we have but it Is metabolized by the liver.  This is why Dr. Harrington Challenger would like to recheck her bloodwork in 8 weeks to check her liver function.

## 2019-11-09 NOTE — Telephone Encounter (Signed)
I have reviewed CT scan from 2017   Cysts were noted as simple, nonenhancing There was no radiologic follow up recommended   Radiology is very good at recomm repeat scans IF/WHEN there are features of things suspicious.

## 2019-11-09 NOTE — Telephone Encounter (Signed)
-----   Message from Dorris Carnes V, MD sent at 11/06/2019 10:07 PM EDT ----- LDL is severely elevated 207   Stable She had CT recently that showed coronary artery plaquing as well as atherosclerosis of aorta SHe should be on a statin.   WOuld start with Crestor 20 mg   F/U lipids in 8 wks with AST and CK

## 2019-11-09 NOTE — Telephone Encounter (Signed)
Left message on mobile number.   Unable to get through on home number.  Sent reply to [employer].

## 2019-11-09 NOTE — Telephone Encounter (Signed)
Patient has been informed. She want Dr. Harrington Challenger to know that in 2017 at [facility] was done that showed 3 liver cysts.  The largest is 8.5 x 6.8 cm and there was no recommended intervention.  Watch them.

## 2019-11-17 DIAGNOSIS — D3132 Benign neoplasm of left choroid: Secondary | ICD-10-CM | POA: Diagnosis not present

## 2019-11-17 DIAGNOSIS — H04129 Dry eye syndrome of unspecified lacrimal gland: Secondary | ICD-10-CM | POA: Diagnosis not present

## 2019-11-17 DIAGNOSIS — H5203 Hypermetropia, bilateral: Secondary | ICD-10-CM | POA: Diagnosis not present

## 2019-11-17 DIAGNOSIS — H2513 Age-related nuclear cataract, bilateral: Secondary | ICD-10-CM | POA: Diagnosis not present

## 2019-11-29 DIAGNOSIS — L738 Other specified follicular disorders: Secondary | ICD-10-CM | POA: Diagnosis not present

## 2019-11-29 DIAGNOSIS — D1801 Hemangioma of skin and subcutaneous tissue: Secondary | ICD-10-CM | POA: Diagnosis not present

## 2019-11-29 DIAGNOSIS — L821 Other seborrheic keratosis: Secondary | ICD-10-CM | POA: Diagnosis not present

## 2019-11-29 DIAGNOSIS — D225 Melanocytic nevi of trunk: Secondary | ICD-10-CM | POA: Diagnosis not present

## 2019-11-29 DIAGNOSIS — L720 Epidermal cyst: Secondary | ICD-10-CM | POA: Diagnosis not present

## 2019-11-29 DIAGNOSIS — L308 Other specified dermatitis: Secondary | ICD-10-CM | POA: Diagnosis not present

## 2019-12-22 NOTE — Progress Notes (Signed)
Cardiology Office Note   Date:  12/23/2019   ID:  Amber, Escobar 1950/06/17, MRN [id number]  PCP:  Lyman Bishop, DO  Cardiologist:   Dorris Carnes, MD    Pt presents for f/u of palpitaitons    History of Present Illness: Amber Escobar is a 69 y.o. female with a history of palpitations and profound dyslipidemia I saw the pt once in 2017  The pt is active   Prior to [place] pandemic she was doing silver sneakers but keeping up with 69 yo She deneis CP  No dizziness  No palpitatons Brings in labs today to review  BP at home 120s   Current Outpatient Medications  Medication Sig Dispense Refill  . ALPRAZolam (XANAX) 0.25 MG tablet Take 1 tablet by mouth as needed.    Marland Kitchen aspirin 81 MG tablet Take 81 mg by mouth daily.    . Cholecalciferol (VITAMIN D-3) 1000 units CAPS Take 1,000 Units by mouth daily.    Marland Kitchen estradiol (ESTRACE) 0.1 MG/GM vaginal cream Use 1/2 g vaginally every night for the first 2 weeks, then use 1/2 g vaginally two times a week. 42.5 g 1  . Multiple Vitamin (MULTIVITAMIN) tablet Take 1 tablet by mouth daily.    . Multiple Vitamins-Minerals (PRESERVISION AREDS 2) CAPS Take 1 tablet by mouth 2 (two) times daily.    . Omega-3 Fatty Acids (FISH OIL) 1200 MG CAPS Take by mouth daily.    Marland Kitchen Phytosterol Esters (PHYTOSTEROL COMPLEX PO) Take 937 mg by mouth 2 (two) times daily.     No current facility-administered medications for this visit.    Allergies:   Patient has no known allergies.   Past Medical History:  Diagnosis Date  . Asthma   . Benign liver cyst    hx of benign liver cyst--Dr.Outlaw  . Cervical cancer ([place])   . Choroidal nevus of left eye   . 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 Endometriosis   . H/O colonoscopy 2012  . Hyperlipidemia   . Osteopenia 2017   Hip and spine  . Tubular adenoma of colon   . Urinary incontinence     Past Surgical History:  Procedure Laterality Date  . ENDOCERVICAL ABLATION    . midurethral sling  2008    double right ureter  . VARICOSE VEIN SURGERY Left 12/2011   left left     Social History:  The patient  reports that she has never smoked. She has never used smokeless tobacco. She reports current alcohol use of about 7.0 standard drinks of alcohol per week. She reports that she does not use drugs.   Family History:  The patient's family history includes COPD in her mother; CVA in her maternal grandfather, maternal grandmother, and mother; Cirrhosis in her paternal grandfather; Congestive Heart Failure in her father; Healthy in her brother; Hypertension in her mother; Rheum arthritis in her father and paternal grandmother; Stroke in her mother.    ROS:  Please see the history of present illness. All other systems are reviewed and  Negative to the above problem except as noted.    PHYSICAL EXAM: VS:  BP 132/86   Pulse 72   Ht 5\' 1"  (1.549 m)   Wt 134 lb 3.2 oz (60.9 kg)   LMP 04/14/2002   BMI 25.36 kg/m   GEN: Well nourished, well developed, in no acute distress HEENT: normal Neck: no JVD, carotid bruits Cardiac: RRR; no murmursno :E  edema  Respiratory:  clear  to auscultation bilaterally, normal work of breathing GI: soft, nontender, nondistended, + BS  No hepatomegaly  MS: no deformity Moving all extremities   Skin: warm and dry, no rash Neuro:  Strength and sensation are intact Psych: euthymic mood, full affect   EKG:  EKG is ordered today.    SR 72 bpm     Lipid Panel    Component Value Date/Time   CHOL 315 (H) 10/28/2019 0813   TRIG 93 10/28/2019 0813   HDL 93 10/28/2019 0813   CHOLHDL 3.4 10/28/2019 0813   LDLCALC 207 (H) 10/28/2019 0813      Wt Readings from Last 3 Encounters:  12/23/19 134 lb 3.2 oz (60.9 kg)  05/23/19 135 lb 9.6 oz (61.5 kg)  03/24/19 132 lb (59.9 kg)      ASSESSMENT AND PLAN: 1  Palpitations Pt denies     2  HL   Last lipids LDL 207  HDL 93   Trig 93   Pt had calcium score CT in 2020  Score was 0   Thre was very mild atherosclerosis  of aorta    Note this was second scan she had   Initial in 2000s was 0 as well   Will review   She is concerned because of liver cysts in past   (followed in GI; resolved)     Disposition:   FU with me will depend on tests results    Signed, Dorris Carnes, MD  12/23/2019 4:20 PM    [facility] [place], [place], [place]  [address] Phone: [phone]; Fax: [phone]

## 2019-12-23 ENCOUNTER — Encounter: Payer: Self-pay | Admitting: Internal Medicine

## 2019-12-23 ENCOUNTER — Other Ambulatory Visit: Payer: Self-pay

## 2019-12-23 ENCOUNTER — Ambulatory Visit (INDEPENDENT_AMBULATORY_CARE_PROVIDER_SITE_OTHER): Payer: Medicare Other | Admitting: Internal Medicine

## 2019-12-23 VITALS — BP 132/86 | HR 72 | Ht 61.0 in | Wt 134.2 lb

## 2019-12-23 DIAGNOSIS — E785 Hyperlipidemia, unspecified: Secondary | ICD-10-CM

## 2019-12-23 NOTE — Patient Instructions (Signed)
Medication Instructions:  No changes today *If you need a refill on your cardiac medications before your next appointment, please call your pharmacy*   Lab Work: None today If you have labs (blood work) drawn today and your tests are completely normal, you will receive your results only by: Marland Kitchen MyChart Message (if you have MyChart) OR . A paper copy in the mail If you have any lab test that is abnormal or we need to change your treatment, we will call you to review the results.   Testing/Procedures: none   Follow-Up: At [facility], you and your health needs are our priority.  As part of our continuing mission to provide you with exceptional heart care, we have created designated Provider Care Teams.  These Care Teams include your primary Cardiologist (physician) and Advanced Practice Providers (APPs -  Physician Assistants and Nurse Practitioners) who all work together to provide you with the care you need, when you need it.     Other Instructions

## 2020-01-05 ENCOUNTER — Other Ambulatory Visit: Payer: Medicare Other

## 2020-01-05 DIAGNOSIS — L723 Sebaceous cyst: Secondary | ICD-10-CM | POA: Diagnosis not present

## 2020-01-07 DIAGNOSIS — Z23 Encounter for immunization: Secondary | ICD-10-CM | POA: Diagnosis not present

## 2020-01-13 DIAGNOSIS — E785 Hyperlipidemia, unspecified: Secondary | ICD-10-CM

## 2020-01-15 NOTE — Telephone Encounter (Signed)
That is fine with pravastatin if that is what she is agreeable to My reasoning for Crestor was that less dose (less drug) has more effect   I am trying to minimize meds  10 mg Crestor has more lipid effect than 40 of Pravachol But if she wants to start with pravastatin would try 20 mg pravastatin with follow up in 8 wks with lipids and liver panel

## 2020-01-18 MED ORDER — PRAVASTATIN SODIUM 20 MG PO TABS: 20.0000 mg | ORAL_TABLET | Freq: Every evening | ORAL | 3 refills | Status: DC

## 2020-01-28 DIAGNOSIS — S9032XA Contusion of left foot, initial encounter: Secondary | ICD-10-CM | POA: Diagnosis not present

## 2020-01-30 DIAGNOSIS — Z23 Encounter for immunization: Secondary | ICD-10-CM | POA: Diagnosis not present

## 2020-02-03 DIAGNOSIS — S9032XA Contusion of left foot, initial encounter: Secondary | ICD-10-CM | POA: Diagnosis not present

## 2020-03-16 ENCOUNTER — Other Ambulatory Visit: Payer: Medicare Other

## 2020-03-19 ENCOUNTER — Other Ambulatory Visit: Payer: Self-pay | Admitting: *Deleted

## 2020-03-19 DIAGNOSIS — E785 Hyperlipidemia, unspecified: Secondary | ICD-10-CM

## 2020-03-23 ENCOUNTER — Other Ambulatory Visit: Payer: Medicare Other

## 2020-03-23 DIAGNOSIS — E785 Hyperlipidemia, unspecified: Secondary | ICD-10-CM | POA: Diagnosis not present

## 2020-03-24 LAB — HEPATIC FUNCTION PANEL
ALT: 37 IU/L — ABNORMAL HIGH (ref 0–32)
AST: 29 IU/L (ref 0–40)
Albumin: 4.6 g/dL (ref 3.8–4.8)
Alkaline Phosphatase: 94 IU/L (ref 44–121)
Bilirubin Total: 0.4 mg/dL (ref 0.0–1.2)
Bilirubin, Direct: 0.12 mg/dL (ref 0.00–0.40)
Total Protein: 7 g/dL (ref 6.0–8.5)

## 2020-03-24 LAB — LIPID PANEL
Chol/HDL Ratio: 2.6 ratio (ref 0.0–4.4)
Cholesterol, Total: 242 mg/dL — ABNORMAL HIGH (ref 100–199)
HDL: 94 mg/dL (ref >39)
LDL Chol Calc (NIH): 133 mg/dL — ABNORMAL HIGH (ref 0–99)
Triglycerides: 88 mg/dL (ref 0–149)
VLDL Cholesterol Cal: 15 mg/dL (ref 5–40)

## 2020-03-29 DIAGNOSIS — E785 Hyperlipidemia, unspecified: Secondary | ICD-10-CM

## 2020-04-02 NOTE — Telephone Encounter (Signed)
Called patient in response to her MyChart message. Would like to stay on pravastatin since he has only been taking it for 6 weeks.   I offered that we could recheck in 6 weeks as at a certain point the lowering effect of the medication wears off.  Pt prefers to recheck in 3 months. Lab orders placed.  She will go to the [facility] office for the labs. She would like to know what the goal is for her LDL, stating she had good calcium score and good HDL.  Read about side effects of Crestor vs pravastatin.

## 2020-04-14 DIAGNOSIS — M810 Age-related osteoporosis without current pathological fracture: Secondary | ICD-10-CM

## 2020-04-14 HISTORY — DX: Age-related osteoporosis without current pathological fracture: M81.0

## 2020-04-19 ENCOUNTER — Other Ambulatory Visit: Payer: Self-pay | Admitting: Obstetrics and Gynecology

## 2020-04-19 DIAGNOSIS — E2839 Other primary ovarian failure: Secondary | ICD-10-CM

## 2020-04-19 DIAGNOSIS — Z1231 Encounter for screening mammogram for malignant neoplasm of breast: Secondary | ICD-10-CM

## 2020-04-26 DIAGNOSIS — J0110 Acute frontal sinusitis, unspecified: Secondary | ICD-10-CM | POA: Diagnosis not present

## 2020-05-09 ENCOUNTER — Other Ambulatory Visit: Payer: Self-pay

## 2020-05-09 ENCOUNTER — Ambulatory Visit (INDEPENDENT_AMBULATORY_CARE_PROVIDER_SITE_OTHER): Payer: Medicare Other | Admitting: Podiatry

## 2020-05-09 ENCOUNTER — Telehealth: Payer: Self-pay | Admitting: Podiatry

## 2020-05-09 ENCOUNTER — Ambulatory Visit (INDEPENDENT_AMBULATORY_CARE_PROVIDER_SITE_OTHER): Payer: Medicare Other

## 2020-05-09 DIAGNOSIS — M779 Enthesopathy, unspecified: Secondary | ICD-10-CM | POA: Diagnosis not present

## 2020-05-09 DIAGNOSIS — M79671 Pain in right foot: Secondary | ICD-10-CM

## 2020-05-09 DIAGNOSIS — M79672 Pain in left foot: Secondary | ICD-10-CM

## 2020-05-09 DIAGNOSIS — M21619 Bunion of unspecified foot: Secondary | ICD-10-CM

## 2020-05-09 MED ORDER — TRIAMCINOLONE ACETONIDE 10 MG/ML IJ SUSP
10.0000 mg | Freq: Once | INTRAMUSCULAR | Status: AC
  Administered 2020-05-09: 10 mg

## 2020-05-09 NOTE — Progress Notes (Signed)
Subjective:   Patient ID: Amber Escobar, female   DOB: 70 y.o.   MRN: [id number]   HPI Patient states she is had a lot of pain around the head of the fifth metatarsal left and no she has bunions of both feet and has had orthotics in the past which were beneficial but they have worn out as they are 70 years old.  Patient states his pain on the outside left foot is worse when she walks around her house and is not wearing supportive shoes.  Patient does not smoke likes to be active   Review of Systems  All other systems reviewed and are negative.       Objective:  Physical Exam Vitals and nursing note reviewed.  Constitutional:      Appearance: She is well-developed and well-nourished.  Cardiovascular:     Pulses: Intact distal pulses.  Pulmonary:     Effort: Pulmonary effort is normal.  Musculoskeletal:        General: Normal range of motion.  Skin:    General: Skin is warm.  Neurological:     Mental Status: She is alert.     Neurovascular status intact muscle strength was found to be adequate range of motion adequate with bunion deformity bilateral and inflammation fluid buildup around the fifth MPJ left foot.  Patient is found to have good digital perfusion is well oriented x3     Assessment:  Inflammatory capsulitis fifth MPJ left with structural bunion deformity moderate to significant in nature bilateral     Plan:  H&P reviewed conditions and will get a focus on the acute inflammation I did do sterile prep and injected with 3 mg Dexasone Kenalog 5 mg Xylocaine to shrink inflammation and I then went ahead and casted for functional orthotics to lift the arch is.  Patient will be seen back to recheck and we discussed bunions do not recommend bunion correction currently  X-rays indicate that there is a structural bunion deformity bilateral with deviation of the hallux and moderate tailor's bunion deformity bilateral

## 2020-05-09 NOTE — Telephone Encounter (Signed)
Pt left message asking if she could pick up the orthotics that she was measured for today in [place] office.  I returned call and told pt that Liliane Channel does not travel to that office so she is keeping appt in [place] office on 2.23.22

## 2020-05-23 ENCOUNTER — Other Ambulatory Visit: Payer: Self-pay

## 2020-05-23 ENCOUNTER — Encounter: Payer: Self-pay | Admitting: Obstetrics and Gynecology

## 2020-05-23 ENCOUNTER — Ambulatory Visit (INDEPENDENT_AMBULATORY_CARE_PROVIDER_SITE_OTHER): Payer: Medicare Other | Admitting: Obstetrics and Gynecology

## 2020-05-23 VITALS — BP 142/78 | HR 69 | Ht 60.5 in | Wt 131.8 lb

## 2020-05-23 DIAGNOSIS — H6981 Other specified disorders of Eustachian tube, right ear: Secondary | ICD-10-CM | POA: Diagnosis not present

## 2020-05-23 DIAGNOSIS — Z1239 Encounter for other screening for malignant neoplasm of breast: Secondary | ICD-10-CM

## 2020-05-23 DIAGNOSIS — M8580 Other specified disorders of bone density and structure, unspecified site: Secondary | ICD-10-CM | POA: Diagnosis not present

## 2020-05-23 DIAGNOSIS — Z01411 Encounter for gynecological examination (general) (routine) with abnormal findings: Secondary | ICD-10-CM | POA: Diagnosis not present

## 2020-05-23 DIAGNOSIS — N952 Postmenopausal atrophic vaginitis: Secondary | ICD-10-CM | POA: Diagnosis not present

## 2020-05-23 MED ORDER — NYSTATIN-TRIAMCINOLONE 100000-0.1 UNIT/GM-% EX CREA: 1.0000 "application " | TOPICAL_CREAM | Freq: Two times a day (BID) | CUTANEOUS | 0 refills | Status: DC

## 2020-05-23 MED ORDER — ESTRADIOL 0.1 MG/GM VA CREA: TOPICAL_CREAM | VAGINAL | 0 refills | Status: DC

## 2020-05-23 NOTE — Patient Instructions (Signed)

## 2020-05-23 NOTE — Progress Notes (Signed)
GYNECOLOGY  VISIT   HPI: 70 y.o.   Married  Caucasian  female   G2P2002 with Patient's last menstrual period was 04/14/2002.   here for breast and pelvic exam.   She is followed for atrophy and osteopenia.   On record review, she does not have cervical cancer. Paps have always been normal.  Patient would like a refill on Mycolog II for vulvitis.   Bladder control is good.   Received her Covid booster.  GYNECOLOGIC HISTORY: Patient's last menstrual period was 04/14/2002. Contraception: PMP Menopausal hormone therapy: Estrace cream Last mammogram: 05-17-19 Neg/Birads1--Appt.05-30-20 Last pap smear: 05-23-19 Neg, 03-18-17 Neg, 02-23-15 Neg:Neg HR HPV. No hx of abnormal paps        OB History    Gravida  2   Para  2   Term  2   Preterm      AB      Living  2     SAB      IAB      Ectopic      Multiple      Live Births                 There are no problems to display for this patient.   Past Medical History:  Diagnosis Date  . Asthma   . Benign liver cyst    hx of benign liver cyst--Dr.Outlaw  . Choroidal nevus of left eye   . 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 Endometriosis   . H/O colonoscopy 2012  . Hyperlipidemia   . Osteopenia 2017   Hip and spine  . Tubular adenoma of colon   . Urinary incontinence     Past Surgical History:  Procedure Laterality Date  . ENDOCERVICAL ABLATION    . midurethral sling  2008   double right ureter  . VARICOSE VEIN SURGERY Left 12/2011   left left    Current Outpatient Medications  Medication Sig Dispense Refill  . ALPRAZolam (XANAX) 0.25 MG tablet Take 1 tablet by mouth as needed.    Marland Kitchen aspirin 81 MG tablet Take 81 mg by mouth daily.    . Cholecalciferol (VITAMIN D-3) 1000 units CAPS Take 1,000 Units by mouth daily.    . clobetasol ointment (TEMOVATE) 0.05 %     . estradiol (ESTRACE) 0.1 MG/GM vaginal cream Use 1/2 g vaginally every night for the first 2 weeks, then use 1/2 g vaginally two times a week. 42.5 g 1  .  Multiple Vitamin (MULTIVITAMIN) tablet Take 1 tablet by mouth daily.    . Multiple Vitamins-Minerals (PRESERVISION AREDS 2) CAPS Take 1 tablet by mouth 2 (two) times daily.    . Omega-3 Fatty Acids (FISH OIL) 1200 MG CAPS Take by mouth daily.    . pravastatin (PRAVACHOL) 20 MG tablet Take 1 tablet (20 mg total) by mouth every evening. 90 tablet 3   No current facility-administered medications for this visit.     ALLERGIES: Patient has no known allergies.  Family History  Problem Relation Age of Onset  . Hypertension Mother   . CVA Mother   . Stroke Mother   . COPD Mother   . Rheum arthritis Father   . Congestive Heart Failure Father   . Healthy Brother   . CVA Maternal Grandmother   . CVA Maternal Grandfather   . Rheum arthritis Paternal Grandmother   . Cirrhosis Paternal Grandfather     Social History   Socioeconomic History  . Marital status: Married  Spouse name: Not on file  . Number of children: Not on file  . Years of education: Not on file  . Highest education level: Not on file  Occupational History  . Occupation: MEDICAL BILLER  Tobacco Use  . Smoking status: Never Smoker  . Smokeless tobacco: Never Used  Vaping Use  . Vaping Use: Never used  Substance and Sexual Activity  . Alcohol use: Yes    Alcohol/week: 7.0 standard drinks    Types: 7 Glasses of wine per week  . Drug use: No  . Sexual activity: Not Currently    Partners: Male    Birth control/protection: Post-menopausal  Other Topics Concern  . Not on file  Social History Narrative  . Not on file   Social Determinants of Health   Financial Resource Strain: Not on file  Food Insecurity: Not on file  Transportation Needs: Not on file  Physical Activity: Not on file  Stress: Not on file  Social Connections: Not on file  Intimate Partner Violence: Not on file    Review of Systems  All other systems reviewed and are negative.   PHYSICAL EXAMINATION:    BP (!) 142/78   Pulse 69   Ht 5'  0.5" (1.537 m)   Wt 131 lb 12.8 oz (59.8 kg)   LMP 04/14/2002   SpO2 98%   BMI 25.32 kg/m     General appearance: alert, cooperative and appears stated age Head: Normocephalic, without obvious abnormality, atraumatic Neck: no adenopathy, supple, symmetrical, trachea midline and thyroid normal to inspection and palpation Lungs: clear to auscultation bilaterally Breasts: normal appearance, no masses or tenderness, No nipple retraction or dimpling, No nipple discharge or bleeding, No axillary or supraclavicular adenopathy Heart: regular rate and rhythm Abdomen: soft, non-tender, no masses,  no organomegaly Extremities: extremities normal, atraumatic, no cyanosis or edema Skin: Skin color, texture, turgor normal. No rashes or lesions Lymph nodes: Cervical, supraclavicular, and axillary nodes normal. No abnormal inguinal nodes palpated Neurologic: Grossly normal  Pelvic: External genitalia:  no lesions              Urethra:  normal appearing urethra with no masses, tenderness or lesions              Bartholins and Skenes: normal                 Vagina: normal appearing vagina with normal color and discharge, no lesions. Vaginal atrophy.              Cervix: no lesions                Bimanual Exam:  Uterus:  normal size, contour, position, consistency, mobility, non-tender              Adnexa: no mass, fullness, tenderness              Rectal exam: Yes.  .  Confirms.              Anus:  normal sphincter tone, no lesions  Chaperone was present for exam.  ASSESSMENT  Screening breast and pelvic exam with atrophy. Probable small fibroid on CT scan.  Hx midurethral sling.  Osteopenia. Estrogen deficiency.  Hx vulvitis.  PLAN  Medical record corrected to remove history of cervical cancer. Refill of vaginal estrogen cream and Mycolog II.  I discussed effect of estrogen cream on breast cancer.  Bone density from 2020 reviewed.  Bone density and mammogram are scheduled.  I  recommended  daily calcium and vitamin through food sources.  Pap next year.   31 min  total time was spent for this patient encounter, including preparation, face-to-face counseling with the patient, coordination of care, and documentation of the encounter.

## 2020-05-30 ENCOUNTER — Other Ambulatory Visit: Payer: Self-pay

## 2020-05-30 ENCOUNTER — Ambulatory Visit
Admission: RE | Admit: 2020-05-30 | Discharge: 2020-05-30 | Disposition: A | Discharge status: Home / Self Care | Payer: Medicare Other | Source: Ambulatory Visit | Attending: Obstetrics and Gynecology | Admitting: Obstetrics and Gynecology

## 2020-05-30 DIAGNOSIS — Z1231 Encounter for screening mammogram for malignant neoplasm of breast: Secondary | ICD-10-CM | POA: Diagnosis not present

## 2020-06-06 ENCOUNTER — Ambulatory Visit: Payer: Medicare Other | Admitting: Orthotics

## 2020-06-06 ENCOUNTER — Other Ambulatory Visit: Payer: Self-pay

## 2020-06-06 DIAGNOSIS — M79672 Pain in left foot: Secondary | ICD-10-CM

## 2020-06-06 DIAGNOSIS — M79671 Pain in right foot: Secondary | ICD-10-CM

## 2020-06-06 DIAGNOSIS — M21619 Bunion of unspecified foot: Secondary | ICD-10-CM

## 2020-06-06 DIAGNOSIS — M779 Enthesopathy, unspecified: Secondary | ICD-10-CM

## 2020-06-06 NOTE — Progress Notes (Signed)
Patient picked up f/o and was pleased with fit, comfort, and function.  Worked well with footwear.  Told of rbeak in period and how to report any issues.  

## 2020-06-19 ENCOUNTER — Telehealth: Payer: Self-pay | Admitting: Internal Medicine

## 2020-06-19 DIAGNOSIS — E785 Hyperlipidemia, unspecified: Secondary | ICD-10-CM | POA: Diagnosis not present

## 2020-06-19 NOTE — Telephone Encounter (Signed)
Lab orders for lipids/liver released.

## 2020-06-19 NOTE — Telephone Encounter (Signed)
Melissa from [facility] calling for lab order to be released. She states the patient is there to get her lab work done. She states she does not need a call back.

## 2020-06-20 LAB — LIPID PANEL
Chol/HDL Ratio: 3 ratio (ref 0.0–4.4)
Cholesterol, Total: 259 mg/dL — ABNORMAL HIGH (ref 100–199)
HDL: 86 mg/dL (ref >39)
LDL Chol Calc (NIH): 158 mg/dL — ABNORMAL HIGH (ref 0–99)
Triglycerides: 90 mg/dL (ref 0–149)
VLDL Cholesterol Cal: 15 mg/dL (ref 5–40)

## 2020-06-20 LAB — HEPATIC FUNCTION PANEL
ALT: 32 IU/L (ref 0–32)
AST: 22 IU/L (ref 0–40)
Albumin: 4.2 g/dL (ref 3.8–4.8)
Alkaline Phosphatase: 80 IU/L (ref 44–121)
Bilirubin Total: 0.4 mg/dL (ref 0.0–1.2)
Bilirubin, Direct: 0.13 mg/dL (ref 0.00–0.40)
Total Protein: 6.7 g/dL (ref 6.0–8.5)

## 2020-06-26 DIAGNOSIS — E785 Hyperlipidemia, unspecified: Secondary | ICD-10-CM

## 2020-07-17 DIAGNOSIS — E785 Hyperlipidemia, unspecified: Secondary | ICD-10-CM | POA: Diagnosis not present

## 2020-07-17 DIAGNOSIS — K7689 Other specified diseases of liver: Secondary | ICD-10-CM | POA: Diagnosis not present

## 2020-07-17 DIAGNOSIS — R109 Unspecified abdominal pain: Secondary | ICD-10-CM | POA: Diagnosis not present

## 2020-07-17 DIAGNOSIS — F419 Anxiety disorder, unspecified: Secondary | ICD-10-CM | POA: Diagnosis not present

## 2020-07-17 DIAGNOSIS — Z862 Personal history of diseases of the blood and blood-forming organs and certain disorders involving the immune mechanism: Secondary | ICD-10-CM | POA: Diagnosis not present

## 2020-07-17 DIAGNOSIS — Z0000 Encounter for general adult medical examination without abnormal findings: Secondary | ICD-10-CM | POA: Diagnosis not present

## 2020-07-17 DIAGNOSIS — R1031 Right lower quadrant pain: Secondary | ICD-10-CM | POA: Diagnosis not present

## 2020-07-17 DIAGNOSIS — Z131 Encounter for screening for diabetes mellitus: Secondary | ICD-10-CM | POA: Diagnosis not present

## 2020-07-18 DIAGNOSIS — Z23 Encounter for immunization: Secondary | ICD-10-CM | POA: Diagnosis not present

## 2020-08-06 ENCOUNTER — Other Ambulatory Visit: Payer: Self-pay

## 2020-08-06 ENCOUNTER — Ambulatory Visit
Admission: RE | Admit: 2020-08-06 | Discharge: 2020-08-06 | Disposition: A | Discharge status: Home / Self Care | Payer: Medicare Other | Source: Ambulatory Visit | Attending: Obstetrics and Gynecology | Admitting: Obstetrics and Gynecology

## 2020-08-06 DIAGNOSIS — M8589 Other specified disorders of bone density and structure, multiple sites: Secondary | ICD-10-CM | POA: Diagnosis not present

## 2020-08-06 DIAGNOSIS — M810 Age-related osteoporosis without current pathological fracture: Secondary | ICD-10-CM | POA: Diagnosis not present

## 2020-08-06 DIAGNOSIS — E2839 Other primary ovarian failure: Secondary | ICD-10-CM

## 2020-08-06 DIAGNOSIS — Z780 Asymptomatic menopausal state: Secondary | ICD-10-CM | POA: Diagnosis not present

## 2020-08-09 ENCOUNTER — Encounter: Payer: Self-pay | Admitting: Obstetrics and Gynecology

## 2020-08-22 NOTE — Progress Notes (Deleted)
GYNECOLOGY  VISIT   HPI: 70 y.o.   Married  Caucasian  female   G2P2002 with Patient's last menstrual period was 04/14/2002.   here to discuss BMD results.  GYNECOLOGIC HISTORY: Patient's last menstrual period was 04/14/2002. Contraception:  PMP Menopausal hormone therapy: Estrace cream Last mammogram: 05-30-20 3D/Neg/BiRads1 Last pap smear:  05-23-19 Neg, 03-18-17 Neg, 02-23-15 Neg:Neg HR HPV. No hx of abnormal paps        OB History    Gravida  2   Para  2   Term  2   Preterm      AB      Living  2     SAB      IAB      Ectopic      Multiple      Live Births                 There are no problems to display for this patient.   Past Medical History:  Diagnosis Date  . Asthma   . Benign liver cyst    hx of benign liver cyst--Dr.Outlaw  . Choroidal nevus of left eye   . 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 Endometriosis   . H/O colonoscopy 2012  . Hyperlipidemia   . Osteopenia 2017   Hip and spine  . Osteoporosis 2022  . Tubular adenoma of colon   . Urinary incontinence     Past Surgical History:  Procedure Laterality Date  . ENDOCERVICAL ABLATION    . midurethral sling  2008   double right ureter  . VARICOSE VEIN SURGERY Left 12/2011   left left    Current Outpatient Medications  Medication Sig Dispense Refill  . ALPRAZolam (XANAX) 0.25 MG tablet Take 1 tablet by mouth as needed.    Marland Kitchen aspirin 81 MG tablet Take 81 mg by mouth daily.    . Cholecalciferol (VITAMIN D-3) 1000 units CAPS Take 1,000 Units by mouth daily.    . clobetasol ointment (TEMOVATE) 0.05 %     . estradiol (ESTRACE) 0.1 MG/GM vaginal cream Use 1/2 g vaginally every night for the first 2 weeks, then use 1/2 g vaginally two times a week. 42.5 g 0  . Multiple Vitamin (MULTIVITAMIN) tablet Take 1 tablet by mouth daily.    . Multiple Vitamins-Minerals (PRESERVISION AREDS 2) CAPS Take 1 tablet by mouth 2 (two) times daily.    Marland Kitchen nystatin-triamcinolone (MYCOLOG II) cream Apply 1 application  topically 2 (two) times daily. Apply to affected area BID for up to 7 days. 60 g 0  . Omega-3 Fatty Acids (FISH OIL) 1200 MG CAPS Take by mouth daily.    . pravastatin (PRAVACHOL) 20 MG tablet Take 1 tablet (20 mg total) by mouth every evening. 90 tablet 3   No current facility-administered medications for this visit.     ALLERGIES: Patient has no known allergies.  Family History  Problem Relation Age of Onset  . Hypertension Mother   . CVA Mother   . Stroke Mother   . COPD Mother   . Rheum arthritis Father   . Congestive Heart Failure Father   . Healthy Brother   . CVA Maternal Grandmother   . CVA Maternal Grandfather   . Rheum arthritis Paternal Grandmother   . Cirrhosis Paternal Grandfather     Social History   Socioeconomic History  . Marital status: Married    Spouse name: Not on file  . Number of children: Not on file  .  Years of education: Not on file  . Highest education level: Not on file  Occupational History  . Occupation: MEDICAL BILLER  Tobacco Use  . Smoking status: Never Smoker  . Smokeless tobacco: Never Used  Vaping Use  . Vaping Use: Never used  Substance and Sexual Activity  . Alcohol use: Yes    Alcohol/week: 7.0 standard drinks    Types: 7 Glasses of wine per week  . Drug use: No  . Sexual activity: Not Currently    Partners: Male    Birth control/protection: Post-menopausal  Other Topics Concern  . Not on file  Social History Narrative  . Not on file   Social Determinants of Health   Financial Resource Strain: Not on file  Food Insecurity: Not on file  Transportation Needs: Not on file  Physical Activity: Not on file  Stress: Not on file  Social Connections: Not on file  Intimate Partner Violence: Not on file    Review of Systems  PHYSICAL EXAMINATION:    LMP 04/14/2002     General appearance: alert, cooperative and appears stated age Head: Normocephalic, without obvious abnormality, atraumatic Neck: no adenopathy, supple,  symmetrical, trachea midline and thyroid normal to inspection and palpation Lungs: clear to auscultation bilaterally Breasts: normal appearance, no masses or tenderness, No nipple retraction or dimpling, No nipple discharge or bleeding, No axillary or supraclavicular adenopathy Heart: regular rate and rhythm Abdomen: soft, non-tender, no masses,  no organomegaly Extremities: extremities normal, atraumatic, no cyanosis or edema Skin: Skin color, texture, turgor normal. No rashes or lesions Lymph nodes: Cervical, supraclavicular, and axillary nodes normal. No abnormal inguinal nodes palpated Neurologic: Grossly normal  Pelvic: External genitalia:  no lesions              Urethra:  normal appearing urethra with no masses, tenderness or lesions              Bartholins and Skenes: normal                 Vagina: normal appearing vagina with normal color and discharge, no lesions              Cervix: no lesions                Bimanual Exam:  Uterus:  normal size, contour, position, consistency, mobility, non-tender              Adnexa: no mass, fullness, tenderness              Rectal exam: {yes no:314532}.  Confirms.              Anus:  normal sphincter tone, no lesions  Chaperone was present for exam.  ASSESSMENT     PLAN     An After Visit Summary was printed and given to the patient.  ______ minutes face to face time of which over 50% was spent in counseling.

## 2020-08-27 ENCOUNTER — Ambulatory Visit: Payer: Medicare Other | Admitting: Obstetrics and Gynecology

## 2020-09-04 NOTE — Progress Notes (Signed)
GYNECOLOGY  VISIT   HPI: 70 y.o.   Married  Caucasian  female   G2P2002 with Patient's last menstrual period was 04/14/2002.   here to discuss results of BMD.   BMD 08/06/20 shows osteoporosis of right femur neck.  T score -2.5.  Doing more strenuous exercise.   She does not take on calcium supplement except what is in her MVI.   Enjoys wine.  Not a smoker.   No FH of osteoporosis.   GYNECOLOGIC HISTORY: Patient's last menstrual period was 04/14/2002. Contraception: PMP Menopausal hormone therapy:  Estrace cream Last mammogram: 06-04-20 3D/Neg/BiRads1 Last pap smear: 05-23-19 Neg, 03-18-17 Neg, 02-23-15 Neg:Neg HR HPV. No hx of abnormal paps BMD: 08-06-20 results reveal Osteoporosis        OB History    Gravida  2   Para  2   Term  2   Preterm      AB      Living  2     SAB      IAB      Ectopic      Multiple      Live Births                 There are no problems to display for this patient.   Past Medical History:  Diagnosis Date  . Asthma   . Benign liver cyst    hx of benign liver cyst--Dr.Outlaw  . Choroidal nevus of left eye   . 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 Endometriosis   . H/O colonoscopy 2012  . Hyperlipidemia   . Osteopenia 2017   Hip and spine  . Osteoporosis 2022  . Tubular adenoma of colon   . Urinary incontinence     Past Surgical History:  Procedure Laterality Date  . ENDOCERVICAL ABLATION    . midurethral sling  2008   double right ureter  . VARICOSE VEIN SURGERY Left 12/2011   left left    Current Outpatient Medications  Medication Sig Dispense Refill  . ALPRAZolam (XANAX) 0.25 MG tablet Take 1 tablet by mouth as needed.    Marland Kitchen aspirin 81 MG tablet Take 81 mg by mouth daily.    . Cholecalciferol (VITAMIN D-3) 1000 units CAPS Take 1,000 Units by mouth daily.    . clobetasol ointment (TEMOVATE) 0.05 %     . Coenzyme Q10 (COQ-10) 100 MG CAPS     . estradiol (ESTRACE) 0.1 MG/GM vaginal cream Use 1/2 g vaginally every night  for the first 2 weeks, then use 1/2 g vaginally two times a week. 42.5 g 0  . fluticasone (FLONASE) 50 MCG/ACT nasal spray 1 spray in each nostril    . Multiple Vitamin (MULTIVITAMIN) tablet Take 1 tablet by mouth daily.    . Multiple Vitamins-Minerals (PRESERVISION AREDS 2) CAPS Take 1 tablet by mouth 2 (two) times daily.    Marland Kitchen nystatin-triamcinolone (MYCOLOG II) cream Apply 1 application topically 2 (two) times daily. Apply to affected area BID for up to 7 days. 60 g 0  . pravastatin (PRAVACHOL) 20 MG tablet Take 1 tablet (20 mg total) by mouth every evening. 90 tablet 3   No current facility-administered medications for this visit.     ALLERGIES: Patient has no known allergies.  Family History  Problem Relation Age of Onset  . Hypertension Mother   . CVA Mother   . Stroke Mother   . COPD Mother   . Rheum arthritis Father   . Congestive Heart  Failure Father   . Healthy Brother   . CVA Maternal Grandmother   . CVA Maternal Grandfather   . Rheum arthritis Paternal Grandmother   . Cirrhosis Paternal Grandfather     Social History   Socioeconomic History  . Marital status: Married    Spouse name: Not on file  . Number of children: Not on file  . Years of education: Not on file  . Highest education level: Not on file  Occupational History  . Occupation: MEDICAL BILLER  Tobacco Use  . Smoking status: Never Smoker  . Smokeless tobacco: Never Used  Vaping Use  . Vaping Use: Never used  Substance and Sexual Activity  . Alcohol use: Yes    Alcohol/week: 7.0 standard drinks    Types: 7 Glasses of wine per week  . Drug use: No  . Sexual activity: Not Currently    Partners: Male    Birth control/protection: Post-menopausal  Other Topics Concern  . Not on file  Social History Narrative  . Not on file   Social Determinants of Health   Financial Resource Strain: Not on file  Food Insecurity: Not on file  Transportation Needs: Not on file  Physical Activity: Not on file   Stress: Not on file  Social Connections: Not on file  Intimate Partner Violence: Not on file    Review of Systems  All other systems reviewed and are negative.   PHYSICAL EXAMINATION:    BP (!) 148/84   Pulse 81   Ht 5\' 5"  (1.651 m)   Wt 131 lb (59.4 kg)   LMP 04/14/2002   SpO2 98%   BMI 21.80 kg/m     General appearance: alert, cooperative and appears stated age  ASSESSMENT  Osteoporosis.   PLAN  Bone density report discussed.  Counseled regarding osteoporosis, risk factors, treatment options (bisphosphonates, Evista, Calcitonin, Prolia, Forteo. Patient would like to consider choices. Reading materials provided. Discussed importance of weight bearing exercise and daily calcium 1200 mg daily and vit D.  (Currently on Vit D 50,000 IU weekly.) Will check PTH, CMP, phosphorus, vit D level. Patient does understand that osteoporosis is a silent disease until fracture occurs and understands that there is significant morbidity associated with osteoporotic fracture.

## 2020-09-05 ENCOUNTER — Other Ambulatory Visit: Payer: Self-pay

## 2020-09-05 ENCOUNTER — Ambulatory Visit (INDEPENDENT_AMBULATORY_CARE_PROVIDER_SITE_OTHER): Payer: Medicare Other | Admitting: Obstetrics and Gynecology

## 2020-09-05 ENCOUNTER — Encounter: Payer: Self-pay | Admitting: Obstetrics and Gynecology

## 2020-09-05 VITALS — BP 148/84 | HR 81 | Ht 60.5 in | Wt 131.0 lb

## 2020-09-05 DIAGNOSIS — M810 Age-related osteoporosis without current pathological fracture: Secondary | ICD-10-CM | POA: Diagnosis not present

## 2020-09-05 NOTE — Patient Instructions (Addendum)
Osteoporosis  Osteoporosis happens when the bones become thin and less dense than normal. Osteoporosis makes bones more brittle and fragile and more likely to break (fracture). Over time, osteoporosis can cause your bones to become so weak that they fracture after a minor fall. Bones in the hip, wrist, and spine are most likely to fracture due to osteoporosis. What are the causes? The exact cause of this condition is not known. What increases the risk? You are more likely to develop this condition if you:  Have family members with this condition.  Have poor nutrition.  Use the following: ? Steroid medicines, such as prednisone. ? Anti-seizure medicines. ? Nicotine or tobacco, such as cigarettes, e-cigarettes, and chewing tobacco.  Are female.  Are age 50 or older.  Are not physically active (are sedentary).  Are of European or Asian descent.  Have a small body frame. What are the signs or symptoms? A fracture might be the first sign of osteoporosis, especially if the fracture results from a fall or injury that usually would not cause a bone to break. Other signs and symptoms include:  Pain in the neck or low back.  Stooped posture.  Loss of height. How is this diagnosed? This condition may be diagnosed based on:  Your medical history.  A physical exam.  A bone mineral density test, also called a DXA or DEXA test (dual-energy X-ray absorptiometry test). This test uses X-rays to measure the amount of minerals in your bones. How is this treated? This condition may be treated by:  Making lifestyle changes, such as: ? Including foods with more calcium and vitamin D in your diet. ? Doing weight-bearing and muscle-strengthening exercises. ? Stopping tobacco use. ? Limiting alcohol intake.  Taking medicine to slow the process of bone loss or to increase bone density.  Taking daily supplements of calcium and vitamin D.  Taking hormone replacement medicines, such as  estrogen for women and testosterone for men.  Monitoring your levels of calcium and vitamin D. The goal of treatment is to strengthen your bones and lower your risk for a fracture. Follow these instructions at home: Eating and drinking Include calcium and vitamin D in your diet. Calcium is important for bone health, and vitamin D helps your body absorb calcium. Good sources of calcium and vitamin D include:  Certain fatty fish, such as salmon and tuna.  Products that have calcium and vitamin D added to them (are fortified), such as fortified cereals.  Egg yolks.  Cheese.  Liver.   Activity Do exercises as told by your health care provider. Ask your health care provider what exercises and activities are safe for you. You should do:  Exercises that make you work against gravity (weight-bearing exercises), such as tai chi, yoga, or walking.  Exercises to strengthen muscles, such as lifting weights. Lifestyle  Do not drink alcohol if: ? Your health care provider tells you not to drink. ? You are pregnant, may be pregnant, or are planning to become pregnant.  If you drink alcohol: ? Limit how much you use to:  0-1 drink a day for women.  0-2 drinks a day for men.  Know how much alcohol is in your drink. In the [place] one drink equals one 12 oz bottle of beer (355 mL), one 5 oz glass of wine (148 mL), or one 1 oz glass of hard liquor (44 mL).  Do not use any products that contain nicotine or tobacco, such as cigarettes, e-cigarettes, and chewing tobacco.   If you need help quitting, ask your health care provider. Preventing falls  Use devices to help you move around (mobility aids) as needed, such as canes, walkers, scooters, or crutches.  Keep rooms well-lit and clutter-free.  Remove tripping hazards from walkways, including cords and throw rugs.  Install grab bars in bathrooms and safety rails on stairs.  Use rubber mats in the bathroom and other areas that are often wet or  slippery.  Wear closed-toe shoes that fit well and support your feet. Wear shoes that have rubber soles or low heels.  Review your medicines with your health care provider. Some medicines can cause dizziness or changes in blood pressure, which can increase your risk of falling. General instructions  Take over-the-counter and prescription medicines only as told by your health care provider.  Keep all follow-up visits. This is important. Contact a health care provider if:  You have never been screened for osteoporosis and you are: ? A woman who is age 66 or older. ? A man who is age 32 or older. Get help right away if:  You fall or injure yourself. Summary  Osteoporosis is thinning and loss of density in your bones. This makes bones more brittle and fragile and more likely to break (fracture),even with minor falls.  The goal of treatment is to strengthen your bones and lower your risk for a fracture.  Include calcium and vitamin D in your diet. Calcium is important for bone health, and vitamin D helps your body absorb calcium.  Talk with your health care provider about screening for osteoporosis if you are a woman who is age 32 or older, or a man who is age 39 or older. This information is not intended to replace advice given to you by your health care provider. Make sure you discuss any questions you have with your health care provider. Document Revised: 09/15/2019 Document Reviewed: 09/15/2019 Elsevier Patient Education  2021 [place].  Risedronate Oral Tablets What is this medicine? RISEDRONATE (ris ED roe nate) slows calcium loss from bones. It treats Paget's disease and osteoporosis. It may be used in other people at risk for bone loss. This medicine may be used for other purposes; ask your health care provider or pharmacist if you have questions. COMMON BRAND NAME(S): Actonel What should I tell my health care provider before I take this medicine? They need to know if you  have any of these conditions:  bleeding disorder  dental disease  difficulty swallowing  infection (fever, chills, cough, sore throat, pain or trouble passing urine)  kidney disease  low levels of calcium in the blood  low red blood cell counts  receiving steroids like dexamethasone or prednisone  stomach or intestine problems  trouble sitting or standing for 30 minutes  an unusual or allergic reaction to risedronate, other drugs, foods, dyes or preservatives  pregnant or trying to get pregnant  breast-feeding How should I use this medicine? Take this drug by mouth with a full glass of water. Take it as directed on the prescription label. If you take it once a day, take it at the same time every day. If you take it once a month, take it on the same day of each month. Take the dose right after waking up. Do not eat or drink anything before taking it. Do not take it with any other drink except water. Do not chew or crush the tablet. After taking it, do not eat breakfast, drink, or take any other drugs  or vitamins for at least 30 minutes. Sit or stand up for at least 30 minutes after you take it. Do not lie down. Keep taking it unless your health care provider tells you to stop. A special MedGuide will be given to you by the pharmacist with each prescription and refill. Be sure to read this information carefully each time. Talk to your health care provider about the use of this drug in children. Special care may be needed. Overdosage: If you think you have taken too much of this medicine contact a poison control center or emergency room at once. NOTE: This medicine is only for you. Do not share this medicine with others. What if I miss a dose? If you take your drug once a day, skip it. Take your next dose at the scheduled time the next morning. Do not take two doses on the same day. If you take your drug once a month and the next scheduled dose is more than 7 days away, take the  missed dose on the morning after you remember. Do not take two doses on the same day. If you take your drug once a month and the next scheduled dose is only 1 to 7 days away, skip it. Take the next dose on the morning of the next scheduled dose. Do not take two doses on the same day. What may interact with this medicine?  antacids like aluminum hydroxide or magnesium hydroxide  aspirin  calcium supplements  iron supplements  NSAIDs, medicines for pain and inflammation, like ibuprofen or naproxen  thyroid hormones  vitamins with minerals This list may not describe all possible interactions. Give your health care provider a list of all the medicines, herbs, non-prescription drugs, or dietary supplements you use. Also tell them if you smoke, drink alcohol, or use illegal drugs. Some items may interact with your medicine. What should I watch for while using this medicine? Visit your health care provider for regular checks on your progress. It may be some time before you see the benefit from this drug. Some people who take this drug have severe bone, joint, or muscle pain. This drug may also increase your risk for jaw problems or a broken thigh bone. Tell your health care provider right away if you have severe pain in your jaw, bones, joints, or muscles. Tell you health care provider if you have any pain that does not go away or that gets worse. You should make sure you get enough calcium and vitamin D while you are taking this drug. Discuss the foods you eat and the vitamins you take with your health care provider. You may need blood work done while you are taking this drug. Tell your dentist and dental surgeon that you are taking this drug. You should not have major dental surgery while on this drug. See your dentist to have a dental exam and fix any dental problems before starting this drug. Take good care of your teeth while on this drug. Make sure you see your dentist for regular follow-up  appointments. What side effects may I notice from receiving this medicine? Side effects that you should report to your doctor or health care provider as soon as possible:  allergic reactions (skin rash, itching or hives; swelling of the face, lips, or tongue)  bone pain  changes in vision  eye irritation, itching  general ill feeling or flu-like symptoms  heartburn (burning feeling in chest, often after eating or when lying down)  increase in  blood pressure  infection (fever, chills, cough, sore throat, pain or trouble passing urine)  jaw pain, especially after dental work  joint pain  low calcium levels (fast heartbeat; muscle cramps or pain; pain, tingling, or numbness in the hands or feet; seizures)  muscle pain  painful or difficulty swallowing  redness, blistering, peeling, or loosening of the skin, including inside the mouth Side effects that usually do not require medical attention (report to your doctor or health care provider if they continue or are bothersome):  constipation  depressed mood  diarrhea  dizziness  headache  nausea  stomach pain  swelling of the ankles, feet, hands  trouble sleeping This list may not describe all possible side effects. Call your doctor for medical advice about side effects. You may report side effects to FDA at 1-800-FDA-1088. Where should I keep my medicine? Keep out of the reach of children and pets. Store at room temperature between 20 and 25 degrees C (68 and 77 degrees F). Throw away any unused drug after the expiration date. NOTE: This sheet is a summary. It may not cover all possible information. If you have questions about this medicine, talk to your doctor, pharmacist, or health care provider.  2021 Elsevier/Gold Standard (2019-01-17 12:09:02)  Alendronate Oral Tablets What is this medicine? ALENDRONATE (a LEN droe nate) slows calcium loss from bones. It treats Paget's disease and osteoporosis. It may be  used in other people at risk for bone loss. This medicine may be used for other purposes; ask your health care provider or pharmacist if you have questions. COMMON BRAND NAME(S): Fosamax What should I tell my health care provider before I take this medicine? They need to know if you have any of these conditions:  bleeding disorder  cancer  dental disease  difficulty swallowing  infection (fever, chills, cough, sore throat, pain or trouble passing urine)  kidney disease  low levels of calcium or other minerals in the blood  low red blood cell counts  receiving steroids like dexamethasone or prednisone  stomach or intestine problems  trouble sitting or standing for 30 minutes  an unusual or allergic reaction to alendronate, other drugs, foods, dyes or preservatives  pregnant or trying to get pregnant  breast-feeding How should I use this medicine? Take this drug by mouth with a full glass of water. Take it as directed on the prescription label at the same time every day. Take the dose right after waking up. Do not eat or drink anything before taking it. Do not take it with any other drink except water. Do not chew or crush the tablet. After taking it, do not eat breakfast, drink, or take any other drugs or vitamins for at least 30 minutes. Sit or stand up for at least 30 minutes after you take it. Do not lie down. Keep taking it unless your health care provider tells you to stop. A special MedGuide will be given to you by the pharmacist with each prescription and refill. Be sure to read this information carefully each time. Talk to your health care provider about the use of this drug in children. Special care may be needed. Overdosage: If you think you have taken too much of this medicine contact a poison control center or emergency room at once. NOTE: This medicine is only for you. Do not share this medicine with others. What if I miss a dose? If you take your drug once a day,  skip it. Take your next dose at  the scheduled time the next morning. Do not take two doses on the same day. If you take your drug once a week, take the missed dose on the morning after you remember. Do not take two doses on the same day. What may interact with this medicine?  aluminum hydroxide  antacids  aspirin  calcium supplements  drugs for inflammation like ibuprofen, naproxen, and others  iron supplements  magnesium supplements  vitamins with minerals This list may not describe all possible interactions. Give your health care provider a list of all the medicines, herbs, non-prescription drugs, or dietary supplements you use. Also tell them if you smoke, drink alcohol, or use illegal drugs. Some items may interact with your medicine. What should I watch for while using this medicine? Visit your health care provider for regular checks on your progress. It may be some time before you see the benefit from this drug. Some people who take this drug have severe bone, joint, or muscle pain. This drug may also increase your risk for jaw problems or a broken thigh bone. Tell your health care provider right away if you have severe pain in your jaw, bones, joints, or muscles. Tell you health care provider if you have any pain that does not go away or that gets worse. Tell your dentist and dental surgeon that you are taking this drug. You should not have major dental surgery while on this drug. See your dentist to have a dental exam and fix any dental problems before starting this drug. Take good care of your teeth while on this drug. Make sure you see your dentist for regular follow-up appointments. You should make sure you get enough calcium and vitamin D while you are taking this drug. Discuss the foods you eat and the vitamins you take with your health care provider. You may need blood work done while you are taking this drug. What side effects may I notice from receiving this medicine? Side  effects that you should report to your doctor or health care provider as soon as possible  allergic reactions (skin rash, itching or hives; swelling of the face, lips, or tongue)  bone pain  heartburn (burning feeling in chest, often after eating or when lying down)  jaw pain, especially after dental work  joint pain  low calcium levels (fast heartbeat; muscle cramps or pain; pain, tingling, or numbness in the hands or feet; seizures)  muscle pain  painful or difficulty swallowing  redness, blistering, peeling, or loosening of the skin, including inside the mouth  stomach bleeding (bloody or black, tarry stools; spitting up blood or brown material that looks like coffee grounds) Side effects that usually do not require medical attention (report to your doctor or health care provider if they continue or are bothersome):  changes in taste  constipation  diarrhea  nausea This list may not describe all possible side effects. Call your doctor for medical advice about side effects. You may report side effects to FDA at 1-800-FDA-1088. Where should I keep my medicine? Keep out of the reach of children and pets. Store at room temperature between 15 and 30 degrees C (59 and 86 degrees F). Throw away any unused drug after the expiration date. NOTE: This sheet is a summary. It may not cover all possible information. If you have questions about this medicine, talk to your doctor, pharmacist, or health care provider.  2021 Elsevier/Gold Standard (2018-12-30 10:49:07)  Zoledronic Acid Injection (Paget's Disease, Osteoporosis) What is this medicine?  ZOLEDRONIC ACID (ZOE le dron ik AS id) slows calcium loss from bones. It treats Paget's disease and osteoporosis. It may be used in other people at risk for bone loss. This medicine may be used for other purposes; ask your health care provider or pharmacist if you have questions. COMMON BRAND NAME(S): Reclast, Zometa What should I tell my  health care provider before I take this medicine? They need to know if you have any of these conditions:  bleeding disorder  cancer  dental disease  kidney disease  low levels of calcium in the blood  low red blood cell counts  lung or breathing disease (asthma)  receiving steroids like dexamethasone or prednisone  an unusual or allergic reaction to zoledronic acid, other medicines, foods, dyes, or preservatives  pregnant or trying to get pregnant  breast-feeding How should I use this medicine? This drug is injected into a vein. It is given by a health care provider in a hospital or clinic setting. A special MedGuide will be given to you before each treatment. Be sure to read this information carefully each time. Talk to your health care provider about the use of this drug in children. Special care may be needed. Overdosage: If you think you have taken too much of this medicine contact a poison control center or emergency room at once. NOTE: This medicine is only for you. Do not share this medicine with others. What if I miss a dose? Keep appointments for follow-up doses. It is important not to miss your dose. Call your health care provider if you are unable to keep an appointment. What may interact with this medicine?  certain antibiotics given by injection  NSAIDs, medicines for pain and inflammation, like ibuprofen or naproxen  some diuretics like bumetanide, furosemide  teriparatide This list may not describe all possible interactions. Give your health care provider a list of all the medicines, herbs, non-prescription drugs, or dietary supplements you use. Also tell them if you smoke, drink alcohol, or use illegal drugs. Some items may interact with your medicine. What should I watch for while using this medicine? Visit your health care provider for regular checks on your progress. It may be some time before you see the benefit from this drug. Some people who take this  drug have severe bone, joint, or muscle pain. This drug may also increase your risk for jaw problems or a broken thigh bone. Tell your health care provider right away if you have severe pain in your jaw, bones, joints, or muscles. Tell you health care provider if you have any pain that does not go away or that gets worse. You should make sure you get enough calcium and vitamin D while you are taking this drug. Discuss the foods you eat and the vitamins you take with your health care provider. You may need blood work done while you are taking this drug. Tell your dentist and dental surgeon that you are taking this drug. You should not have major dental surgery while on this drug. See your dentist to have a dental exam and fix any dental problems before starting this drug. Take good care of your teeth while on this drug. Make sure you see your dentist for regular follow-up appointments. What side effects may I notice from receiving this medicine? Side effects that you should report to your doctor or health care provider as soon as possible:  allergic reactions (skin rash, itching or hives; swelling of the face, lips, or tongue)  bone pain  infection (fever, chills, cough, sore throat, pain or trouble passing urine)  jaw pain, especially after dental work  joint pain  kidney injury (trouble passing urine or change in the amount of urine)  low calcium levels (fast heartbeat; muscle cramps or pain; pain, tingling, or numbness in the hands or feet; seizures)  low red blood cell counts (trouble breathing; feeling faint; lightheaded, falls; unusually weak or tired)  muscle pain  palpitations  redness, blistering, peeling, or loosening of the skin, including inside the mouth Side effects that usually do not require medical attention (report to your doctor or health care provider if they continue or are bothersome):  diarrhea  eye irritation, itching, or pain  fever  general ill feeling or  flu-like symptoms  headache  increase in blood pressure  nausea  pain, redness, or irritation at site where injected  stomach pain  upset stomach This list may not describe all possible side effects. Call your doctor for medical advice about side effects. You may report side effects to FDA at 1-800-FDA-1088. Where should I keep my medicine? This drug is given in a hospital or clinic. It will not be stored at home. NOTE: This sheet is a summary. It may not cover all possible information. If you have questions about this medicine, talk to your doctor, pharmacist, or health care provider.  2021 Elsevier/Gold Standard (2019-01-17 11:46:18)  Raloxifene tablets What is this medicine? RALOXIFENE (ral OX i feen) reduces the amount of calcium lost from bones. It is used to treat and prevent osteoporosis in women who have experienced menopause. It may also help prevent invasive breast cancer in certain women who have a high risk for breast cancer. This medicine may be used for other purposes; ask your health care provider or pharmacist if you have questions. COMMON BRAND NAME(S): Evista What should I tell my health care provider before I take this medicine? They need to know if you have any of these conditions:  a history of blood clots  cancer  heart disease or recent heart attack  high levels of triglycerides (blood fat) in the blood  history of stroke  kidney disease  liver disease  premenopausal  smoke tobacco  an unusual or allergic reaction to raloxifene, other medicines, foods, dyes, or preservatives  pregnant or trying to get pregnant  breast-feeding How should I use this medicine? Take this medicine by mouth with a glass of water. Follow the directions on the prescription label. The tablets can be taken with or without food. Take your doses at regular intervals. Do not take your medicine more often than directed. A special MedGuide will be given to you by the  pharmacist with each prescription and refill. Be sure to read this information carefully each time. Talk to your pediatrician regarding the use of this medicine in children. Special care may be needed. Overdosage: If you think you have taken too much of this medicine contact a poison control center or emergency room at once. NOTE: This medicine is only for you. Do not share this medicine with others. What if I miss a dose? If you miss a dose, take it as soon as you can. If it is almost time for your next dose, take only that dose. Do not take double or extra doses. What may interact with this medicine?  cholestyramine  female hormones, like estrogens  warfarin This list may not describe all possible interactions. Give your health care provider a list of all the  medicines, herbs, non-prescription drugs, or dietary supplements you use. Also tell them if you smoke, drink alcohol, or use illegal drugs. Some items may interact with your medicine. What should I watch for while using this medicine? Visit your doctor or health care professional for regular checks on your progress. Do not stop taking this medicine except on the advice of your doctor or health care professional. If you are taking this medicine to reduce your risk of getting breast cancer, you should know that this medicine does not prevent all types of breast cancer. Talk to your doctor if you have questions. This medicine does not prevent hot flashes. It may cause hot flashes in some patients at the start of therapy. You should make sure that you get enough calcium and vitamin D while you are taking this medicine. Discuss the foods you eat and the vitamins you take with your health care professional. Exercise may help to prevent bone loss. Discuss your exercise needs with your doctor or health care professional. This medicine can rarely cause blood clots. If you are going to have surgery, tell your doctor or health care professional that  you are taking this medicine. This medicine should be stopped at least 3 days before surgery. After surgery, it should be restarted only after you are walking again. It should not be restarted while you still need long periods of bed rest. You should not smoke while taking this medicine. Smoking may increase your risk of blood clots or stroke. If you have any reason to think you are pregnant; stop taking this medicine at once and contact your doctor or health care professional. Do not breast feed while taking this medicine. What side effects may I notice from receiving this medicine? Side effects that you should report to your doctor or health care professional as soon as possible:  allergic reactions like skin rash, itching or hives, swelling of the face, lips, or tongue)  breast tissue changes or discharge  signs and symptoms of a blood clot such as breathing problems; changes in vision; chest pain; severe, sudden headache; pain, swelling, warmth in the leg; trouble speaking; sudden numbness or weakness of the face, arm or leg  signs and symptoms of a stroke like changes in vision; confusion; trouble speaking or understanding; severe headaches; sudden numbness or weakness of the face, arm or leg; trouble walking; dizziness; loss of balance or coordination  vaginal discharge that is bloody, brown, or rust Side effects that usually do not require medical attention (report to your doctor or health care professional if they continue or are bothersome):  hot flashes  joint pain  leg cramps  sweating  swelling of the ankles, feet, hands This list may not describe all possible side effects. Call your doctor for medical advice about side effects. You may report side effects to FDA at 1-800-FDA-1088. Where should I keep my medicine? Keep out of the reach of children. Store at room temperature between 15 and 30 degrees C (59 and 86 degrees F). Throw away any unused medicine after the expiration  date. NOTE: This sheet is a summary. It may not cover all possible information. If you have questions about this medicine, talk to your doctor, pharmacist, or health care provider.  2021 Elsevier/Gold Standard (2016-05-07 17:15:34)  Denosumab injection What is this medicine? DENOSUMAB (den oh sue mab) slows bone breakdown. Prolia is used to treat osteoporosis in women after menopause and in men, and in people who are taking corticosteroids for 6 months  or more. Delton See is used to treat a high calcium level due to cancer and to prevent bone fractures and other bone problems caused by multiple myeloma or cancer bone metastases. Delton See is also used to treat giant cell tumor of the bone. This medicine may be used for other purposes; ask your health care provider or pharmacist if you have questions. COMMON BRAND NAME(S): Prolia, XGEVA What should I tell my health care provider before I take this medicine? They need to know if you have any of these conditions:  dental disease  having surgery or tooth extraction  infection  kidney disease  low levels of calcium or Vitamin D in the blood  malnutrition  on hemodialysis  skin conditions or sensitivity  thyroid or parathyroid disease  an unusual reaction to denosumab, other medicines, foods, dyes, or preservatives  pregnant or trying to get pregnant  breast-feeding How should I use this medicine? This medicine is for injection under the skin. It is given by a health care professional in a hospital or clinic setting. A special MedGuide will be given to you before each treatment. Be sure to read this information carefully each time. For Prolia, talk to your pediatrician regarding the use of this medicine in children. Special care may be needed. For Delton See, talk to your pediatrician regarding the use of this medicine in children. While this drug may be prescribed for children as young as 13 years for selected conditions, precautions do  apply. Overdosage: If you think you have taken too much of this medicine contact a poison control center or emergency room at once. NOTE: This medicine is only for you. Do not share this medicine with others. What if I miss a dose? It is important not to miss your dose. Call your doctor or health care professional if you are unable to keep an appointment. What may interact with this medicine? Do not take this medicine with any of the following medications:  other medicines containing denosumab This medicine may also interact with the following medications:  medicines that lower your chance of fighting infection  steroid medicines like prednisone or cortisone This list may not describe all possible interactions. Give your health care provider a list of all the medicines, herbs, non-prescription drugs, or dietary supplements you use. Also tell them if you smoke, drink alcohol, or use illegal drugs. Some items may interact with your medicine. What should I watch for while using this medicine? Visit your doctor or health care professional for regular checks on your progress. Your doctor or health care professional may order blood tests and other tests to see how you are doing. Call your doctor or health care professional for advice if you get a fever, chills or sore throat, or other symptoms of a cold or flu. Do not treat yourself. This drug may decrease your body's ability to fight infection. Try to avoid being around people who are sick. You should make sure you get enough calcium and vitamin D while you are taking this medicine, unless your doctor tells you not to. Discuss the foods you eat and the vitamins you take with your health care professional. See your dentist regularly. Brush and floss your teeth as directed. Before you have any dental work done, tell your dentist you are receiving this medicine. Do not become pregnant while taking this medicine or for 5 months after stopping it. Talk  with your doctor or health care professional about your birth control options while taking this medicine. Women  should inform their doctor if they wish to become pregnant or think they might be pregnant. There is a potential for serious side effects to an unborn child. Talk to your health care professional or pharmacist for more information. What side effects may I notice from receiving this medicine? Side effects that you should report to your doctor or health care professional as soon as possible:  allergic reactions like skin rash, itching or hives, swelling of the face, lips, or tongue  bone pain  breathing problems  dizziness  jaw pain, especially after dental work  redness, blistering, peeling of the skin  signs and symptoms of infection like fever or chills; cough; sore throat; pain or trouble passing urine  signs of low calcium like fast heartbeat, muscle cramps or muscle pain; pain, tingling, numbness in the hands or feet; seizures  unusual bleeding or bruising  unusually weak or tired Side effects that usually do not require medical attention (report to your doctor or health care professional if they continue or are bothersome):  constipation  diarrhea  headache  joint pain  loss of appetite  muscle pain  runny nose  tiredness  upset stomach This list may not describe all possible side effects. Call your doctor for medical advice about side effects. You may report side effects to FDA at 1-800-FDA-1088. Where should I keep my medicine? This medicine is only given in a clinic, doctor's office, or other health care setting and will not be stored at home. NOTE: This sheet is a summary. It may not cover all possible information. If you have questions about this medicine, talk to your doctor, pharmacist, or health care provider.  2021 Elsevier/Gold Standard (2017-08-07 16:10:44)

## 2020-09-06 LAB — COMPREHENSIVE METABOLIC PANEL
AG Ratio: 1.6 (calc) (ref 1.0–2.5)
ALT: 20 U/L (ref 6–29)
AST: 20 U/L (ref 10–35)
Albumin: 4.3 g/dL (ref 3.6–5.1)
Alkaline phosphatase (APISO): 66 U/L (ref 37–153)
BUN: 14 mg/dL (ref 7–25)
CO2: 29 mmol/L (ref 20–32)
Calcium: 9.5 mg/dL (ref 8.6–10.4)
Chloride: 104 mmol/L (ref 98–110)
Creat: 0.55 mg/dL (ref 0.50–0.99)
Globulin: 2.7 g/dL (calc) (ref 1.9–3.7)
Glucose, Bld: 90 mg/dL (ref 65–99)
Potassium: 3.9 mmol/L (ref 3.5–5.3)
Sodium: 142 mmol/L (ref 135–146)
Total Bilirubin: 0.4 mg/dL (ref 0.2–1.2)
Total Protein: 7 g/dL (ref 6.1–8.1)

## 2020-09-06 LAB — VITAMIN D 25 HYDROXY (VIT D DEFICIENCY, FRACTURES): Vit D, 25-Hydroxy: 33 ng/mL (ref 30–100)

## 2020-09-06 LAB — PHOSPHORUS: Phosphorus: 3.9 mg/dL (ref 2.1–4.3)

## 2020-09-07 LAB — PARATHYROID HORMONE, INTACT (NO CA): PTH: 50 pg/mL (ref 16–77)

## 2020-09-13 ENCOUNTER — Encounter: Payer: Self-pay | Admitting: Obstetrics and Gynecology

## 2020-09-18 ENCOUNTER — Encounter: Payer: Self-pay | Admitting: Obstetrics and Gynecology

## 2020-09-20 ENCOUNTER — Telehealth: Payer: Self-pay | Admitting: Gastroenterology

## 2020-09-20 NOTE — Telephone Encounter (Signed)
Pt wants to transfer her care to Dr. Tarri Glenn. She stated that she is due for a repeat colon, she had one 5 years ago and will have her records faxed over for review.

## 2020-10-09 NOTE — Telephone Encounter (Signed)
Hey Dr. Tarri Glenn,   We received a transfer of care for colonoscopy. Patient had colonoscopy in 2016. We have records for review. Could you please review and advise on scheduling?  Thank you

## 2020-10-09 NOTE — Telephone Encounter (Signed)
I will look for those records. Thanks.  

## 2020-10-10 NOTE — Telephone Encounter (Signed)
Reviewed referral records.  Prior care with Dr. Paulita Fujita at [facility] gastroenterology.  Records mention history of polyps on colonoscopy in 2011.  Procedure note was not included in the records.  Colonoscopy 03/01/2015 for a personal history of colon polyps revealed internal hemorrhoids, a 3 mm tubular adenoma in the cecum, and a 6 mm tubular adenoma in the ascending colon.  Surveillance colonoscopy recommended in 5 years.

## 2020-10-11 ENCOUNTER — Encounter: Payer: Self-pay | Admitting: Gastroenterology

## 2020-10-11 NOTE — Telephone Encounter (Signed)
Left voicemail to return call to schedule appointment 

## 2020-11-19 DIAGNOSIS — D3121 Benign neoplasm of right retina: Secondary | ICD-10-CM | POA: Diagnosis not present

## 2020-11-19 DIAGNOSIS — D3122 Benign neoplasm of left retina: Secondary | ICD-10-CM | POA: Diagnosis not present

## 2020-12-12 ENCOUNTER — Other Ambulatory Visit: Payer: Self-pay

## 2020-12-12 ENCOUNTER — Ambulatory Visit (AMBULATORY_SURGERY_CENTER): Payer: Medicare Other | Admitting: *Deleted

## 2020-12-12 VITALS — Ht 65.0 in | Wt 131.0 lb

## 2020-12-12 DIAGNOSIS — Z86010 Personal history of colonic polyps: Secondary | ICD-10-CM

## 2020-12-12 NOTE — Progress Notes (Signed)
Patient's pre-visit was done today over the phone with the patient due to COVID-19 pandemic. Name,DOB and address verified. Insurance verified. Patient denies any allergies to Eggs and Soy. Patient denies any problems with anesthesia/sedation. Patient denies taking diet pills or blood thinners. No home Oxygen. Packet of Prep instructions mailed to patient including a copy of a consent form-pt is aware. Patient understands to call us back with any questions or concerns. Patient is aware of our care-partner policy and 0000000 safety protocol.   EMMI education assigned to the patient for the procedure, sent to [facility].   The patient is COVID-19 vaccinated.  Patient has Plenvu at home.

## 2020-12-19 DIAGNOSIS — L821 Other seborrheic keratosis: Secondary | ICD-10-CM | POA: Diagnosis not present

## 2020-12-19 DIAGNOSIS — L918 Other hypertrophic disorders of the skin: Secondary | ICD-10-CM | POA: Diagnosis not present

## 2020-12-19 DIAGNOSIS — H0262 Xanthelasma of right lower eyelid: Secondary | ICD-10-CM | POA: Diagnosis not present

## 2020-12-19 DIAGNOSIS — D2271 Melanocytic nevi of right lower limb, including hip: Secondary | ICD-10-CM | POA: Diagnosis not present

## 2020-12-19 DIAGNOSIS — D225 Melanocytic nevi of trunk: Secondary | ICD-10-CM | POA: Diagnosis not present

## 2020-12-19 DIAGNOSIS — H0265 Xanthelasma of left lower eyelid: Secondary | ICD-10-CM | POA: Diagnosis not present

## 2020-12-24 ENCOUNTER — Telehealth: Payer: Self-pay | Admitting: Gastroenterology

## 2020-12-24 NOTE — Telephone Encounter (Signed)
Pt stated that she had questions of when to start Prep. All questions were answered.

## 2020-12-24 NOTE — Telephone Encounter (Signed)
Inbound call from patient requesting a call from a nurse please.  Has additional questions in regards to prep instruction.

## 2020-12-26 ENCOUNTER — Ambulatory Visit (AMBULATORY_SURGERY_CENTER): Payer: Medicare Other | Admitting: Gastroenterology

## 2020-12-26 ENCOUNTER — Other Ambulatory Visit: Payer: Self-pay

## 2020-12-26 ENCOUNTER — Encounter: Payer: Self-pay | Admitting: Gastroenterology

## 2020-12-26 VITALS — BP 126/72 | HR 55 | Temp 96.0°F | Resp 13 | Ht 65.0 in | Wt 131.0 lb

## 2020-12-26 DIAGNOSIS — D122 Benign neoplasm of ascending colon: Secondary | ICD-10-CM

## 2020-12-26 DIAGNOSIS — D123 Benign neoplasm of transverse colon: Secondary | ICD-10-CM | POA: Diagnosis not present

## 2020-12-26 DIAGNOSIS — Z86010 Personal history of colonic polyps: Secondary | ICD-10-CM | POA: Diagnosis not present

## 2020-12-26 MED ORDER — SODIUM CHLORIDE 0.9 % IV SOLN: 500.0000 mL | Freq: Once | INTRAVENOUS | Status: DC

## 2020-12-26 NOTE — Progress Notes (Signed)
Report to PACU, RN, vss, BBS= Clear.  

## 2020-12-26 NOTE — Patient Instructions (Signed)
Thank you for allowing [place] to care for you today! Resume previous diet and medications today.  Return to normal daily activities tomorrow. Await pathology results, approximately 2 weeks.  Will make recommendation at that time for future surveillance colonoscopy.   YOU HAD AN ENDOSCOPIC PROCEDURE TODAY AT [facility]:   Refer to the procedure report that was given to you for any specific questions about what was found during the examination.  If the procedure report does not answer your questions, please call your gastroenterologist to clarify.  If you requested that your care partner not be given the details of your procedure findings, then the procedure report has been included in a sealed envelope for you to review at your convenience later.  YOU SHOULD EXPECT: Some feelings of bloating in the abdomen. Passage of more gas than usual.  Walking can help get rid of the air that was put into your GI tract during the procedure and reduce the bloating. If you had a lower endoscopy (such as a colonoscopy or flexible sigmoidoscopy) you may notice spotting of blood in your stool or on the toilet paper. If you underwent a bowel prep for your procedure, you may not have a normal bowel movement for a few days.  Please Note:  You might notice some irritation and congestion in your nose or some drainage.  This is from the oxygen used during your procedure.  There is no need for concern and it should clear up in a day or so.  SYMPTOMS TO REPORT IMMEDIATELY:  Following lower endoscopy (colonoscopy or flexible sigmoidoscopy):  Excessive amounts of blood in the stool  Significant tenderness or worsening of abdominal pains  Swelling of the abdomen that is new, acute  Fever of 100F or higher    For urgent or emergent issues, a gastroenterologist can be reached at any hour by calling [phone]. Do not use MyChart messaging for urgent concerns.    DIET:  We do recommend a small meal at  first, but then you may proceed to your regular diet.  Drink plenty of fluids but you should avoid alcoholic beverages for 24 hours.  ACTIVITY:  You should plan to take it easy for the rest of today and you should NOT DRIVE or use heavy machinery until tomorrow (because of the sedation medicines used during the test).    FOLLOW UP: Our staff will call the number listed on your records 48-72 hours following your procedure to check on you and address any questions or concerns that you may have regarding the information given to you following your procedure. If we do not reach you, we will leave a message.  We will attempt to reach you two times.  During this call, we will ask if you have developed any symptoms of COVID 19. If you develop any symptoms (ie: fever, flu-like symptoms, shortness of breath, cough etc.) before then, please call [phone].  If you test positive for Covid 19 in the 2 weeks post procedure, please call and report this information to [place].    If any biopsies were taken you will be contacted by phone or by letter within the next 1-3 weeks.  Please call us at [phone] if you have not heard about the biopsies in 3 weeks.    SIGNATURES/CONFIDENTIALITY: You and/or your care partner have signed paperwork which will be entered into your electronic medical record.  These signatures attest to the fact that that the information above on your After  Visit Summary has been reviewed and is understood.  Full responsibility of the confidentiality of this discharge information lies with you and/or your care-partner.

## 2020-12-26 NOTE — Op Note (Addendum)
[facility] Patient Name: Amber Escobar Procedure Date: 12/26/2020 10:12 AM MRN: [id number] Endoscopist: Thornton Park MD, MD Age: 70 Referring MD:  Date of Birth: 04-22-1950 Gender: Female Account #: [id number] Procedure:                Colonoscopy Indications:              Surveillance: Personal history of adenomatous                            polyps on last colonoscopy > 5 years ago                           History of colon polyp on colonoscopies with Dr.                            Paulita Fujita in 2010 and 2015. Surveillance recommended                            in 5 years. Pathology results are unknown.                           No known family history of colon cancer or polyps. Medicines:                Monitored Anesthesia Care Procedure:                Pre-Anesthesia Assessment:                           - Prior to the procedure, a History and Physical                            was performed, and patient medications and                            allergies were reviewed. The patient's tolerance of                            previous anesthesia was also reviewed. The risks                            and benefits of the procedure and the sedation                            options and risks were discussed with the patient.                            All questions were answered, and informed consent                            was obtained. Prior Anticoagulants: The patient has                            taken no previous anticoagulant or antiplatelet  agents. ASA Grade Assessment: II - A patient with                            mild systemic disease. After reviewing the risks                            and benefits, the patient was deemed in                            satisfactory condition to undergo the procedure.                           After obtaining informed consent, the colonoscope                            was passed under direct  vision. Throughout the                            procedure, the patient's blood pressure, pulse, and                            oxygen saturations were monitored continuously. The                            PCF-HQ190L Colonoscope was introduced through the                            anus and advanced to the 3 cm into the ileum. A                            second forward view of the right colon was                            performed. The colonoscopy was performed without                            difficulty. The patient tolerated the procedure                            well. The quality of the bowel preparation was                            good. The terminal ileum, ileocecal valve,                            appendiceal orifice, and rectum were photographed. Scope In: 10:27:27 AM Scope Out: 10:39:56 AM Scope Withdrawal Time: 0 hours 8 minutes 59 seconds  Total Procedure Duration: 0 hours 12 minutes 29 seconds  Findings:                 The perianal and digital rectal examinations were                            normal.  A 2 mm polyp was found in the hepatic flexure. The                            polyp was sessile. The polyp was removed with a                            cold snare. Resection and retrieval were complete.                            Estimated blood loss was minimal.                           A 4 mm polyp was found in the ascending colon. The                            polyp was sessile. The polyp was removed with a                            cold snare. Resection and retrieval were complete.                            Estimated blood loss was minimal.                           The exam was otherwise without abnormality on                            direct and retroflexion views. Complications:            No immediate complications. Estimated blood loss:                            Minimal. Estimated Blood Loss:     Estimated blood loss was  minimal. Impression:               - One 2 mm polyp at the hepatic flexure, removed                            with a cold snare. Resected and retrieved.                           - One 4 mm polyp in the ascending colon, removed                            with a cold snare. Resected and retrieved.                           - The examination was otherwise normal on direct                            and retroflexion views. Recommendation:           - Patient has a contact number available for  emergencies. The signs and symptoms of potential                            delayed complications were discussed with the                            patient. Return to normal activities tomorrow.                            Written discharge instructions were provided to the                            patient.                           - Resume previous diet.                           - Continue present medications.                           - Await pathology results.                           - Repeat colonoscopy date to be determined after                            pending pathology results are reviewed for                            surveillance.                           - Emerging evidence supports eating a diet of                            fruits, vegetables, grains, calcium, and yogurt                            while reducing red meat and alcohol may reduce the                            risk of colon cancer.                           - Thank you for allowing me to be involved in your                            colon cancer prevention. Thornton Park MD, MD 12/26/2020 10:45:04 AM This report has been signed electronically. Addendum Number: 1   Addendum Date: 01/08/2021 12:17:13 PM      Please note prior colonoscopy dates were actually 01/19/2010 and       03/01/2015. Detailed results were not available until after the       procedure was performed. Thornton Park MD, MD 01/08/2021 12:17:54 PM This report has been signed electronically.

## 2020-12-26 NOTE — Progress Notes (Signed)
Referring Provider: Lyman Bishop, DO Primary Care Physician:  Lyman Bishop, DO  Reason for Procedure:  Colon cancer screening   IMPRESSION:  History of polyps Need for colon cancer screening  She is an appropriate candidate for endoscopy in the [place]   PLAN: Colonoscopy in the [place] today   HPI: Amber Escobar is a 70 y.o. female presents for surveillance colonoscopy.  Reviewed referral records.  Prior care with Dr. Paulita Fujita at [facility] gastroenterology.  Records mention history of polyps on colonoscopy in 2011.  She reports a follow-up colonoscopy in 2015. Procedure note was not included in the records. Polyps found on each colonoscopy. Surveillance recommended in 5 years,   No baseline GI symptoms.   No known family history of colon cancer or polyps. No family history of uterine/endometrial cancer, pancreatic cancer or gastric/stomach cancer.   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     calcium carbonate (OSCAL) 1500 (600 Ca) MG TABS tablet Take by mouth 2 (two) times daily with a meal.     Cholecalciferol (VITAMIN D-3) 1000 units CAPS Take 1,000 Units by mouth daily.     Coenzyme Q10 (COQ-10) 100 MG CAPS      estradiol (ESTRACE) 0.1 MG/GM vaginal cream Use 1/2 g vaginally  every night for the first 2 weeks, then use 1/2 g vaginally two times a week. 42.5 g 0   Multiple Vitamin (MULTIVITAMIN) tablet Take 1 tablet by mouth daily.     Multiple Vitamins-Minerals (PRESERVISION AREDS 2) CAPS Take 1 tablet by mouth 2 (two) times daily.     pravastatin (PRAVACHOL) 20 MG tablet Take 1 tablet (20 mg total) by mouth every evening. 90 tablet 3   ALPRAZolam (XANAX) 0.25 MG tablet Take 1 tablet by mouth as needed. (Patient not taking: No sig reported)     clobetasol ointment (TEMOVATE) 0.05 %      fluticasone (FLONASE) 50 MCG/ACT nasal spray      nystatin-triamcinolone (MYCOLOG II) cream Apply 1 application topically 2 (two) times daily. Apply to affected area BID for up to 7 days. (Patient not taking: No sig reported) 60 g 0   Current Facility-Administered Medications  Medication Dose Route Frequency Provider Last Rate Last Admin   0.9 %  sodium chloride infusion  500 mL Intravenous Once [place], MD        Allergies as of 12/26/2020   (No Known Allergies)    Family History  Problem Relation Age of Onset   Hypertension Mother    CVA Mother    Stroke Mother    COPD Mother    Rheum  arthritis Father    Congestive Heart Failure Father    Healthy Brother    CVA Maternal Grandmother    CVA Maternal Grandfather    Rheum arthritis Paternal Grandmother    Cirrhosis Paternal Grandfather    Colon cancer Neg Hx    Colon polyps Neg Hx    Esophageal cancer Neg Hx    Rectal cancer Neg Hx    Stomach cancer Neg Hx      Physical Exam: General:   Alert,  well-nourished, pleasant and cooperative in NAD Head:  Normocephalic and atraumatic. Eyes:  Sclera clear, no icterus.   Conjunctiva pink. Mouth:  No deformity or lesions.   Neck:  Supple; no masses or thyromegaly. Lungs:  Clear throughout to auscultation.   No wheezes. Heart:  Regular rate and rhythm; no murmurs. Abdomen:  Soft, non-tender, nondistended, normal bowel sounds, no rebound or guarding.  Msk:   Symmetrical. No boney deformities LAD: No inguinal or umbilical LAD Extremities:  No clubbing or edema. Neurologic:  Alert and  oriented x4;  grossly nonfocal Skin:  No obvious rash or bruise. Psych:  Alert and cooperative. Normal mood and affect.      Joren Rehm L. Tarri Glenn, MD, MPH 12/26/2020, 10:12 AM

## 2020-12-26 NOTE — Progress Notes (Signed)
Called to room to assist during endoscopic procedure.  Patient ID and intended procedure confirmed with present staff. Received instructions for my participation in the procedure from the performing physician.  

## 2020-12-26 NOTE — Progress Notes (Signed)
Pt's states no medical or surgical changes since previsit or office visit. VS assessed by N.C ?

## 2020-12-28 ENCOUNTER — Telehealth: Payer: Self-pay | Admitting: *Deleted

## 2020-12-28 NOTE — Telephone Encounter (Signed)
  Follow up Call-  Call back number 12/26/2020  Post procedure Call Back phone  # [phone]  Permission to leave phone message Yes  Some recent data might be hidden     Patient questions:  Do you have a fever, pain , or abdominal swelling? No. Pain Score  0 *  Have you tolerated food without any problems? Yes.    Have you been able to return to your normal activities? Yes.    Do you have any questions about your discharge instructions: Diet   No. Medications  No. Follow up visit  No.  Do you have questions or concerns about your Care? No.  Actions: * If pain score is 4 or above: No action needed, pain <4.  Have you developed a fever since your procedure? no  2.   Have you had an respiratory symptoms (SOB or cough) since your procedure? no  3.   Have you tested positive for COVID 19 since your procedure no  4.   Have you had any family members/close contacts diagnosed with the COVID 19 since your procedure?  no   If yes to any of these questions please route to Joylene John, RN and Joella Prince, RN

## 2020-12-31 ENCOUNTER — Other Ambulatory Visit: Payer: Self-pay | Admitting: *Deleted

## 2020-12-31 MED ORDER — PRAVASTATIN SODIUM 20 MG PO TABS: 20.0000 mg | ORAL_TABLET | Freq: Every evening | ORAL | 0 refills | Status: DC

## 2021-01-01 DIAGNOSIS — Z23 Encounter for immunization: Secondary | ICD-10-CM | POA: Diagnosis not present

## 2021-01-08 ENCOUNTER — Encounter: Payer: Self-pay | Admitting: Gastroenterology

## 2021-01-08 NOTE — Telephone Encounter (Signed)
Addendum added to the procedure note with the exact dates of prior procedures. Thanks.

## 2021-01-25 DIAGNOSIS — Z23 Encounter for immunization: Secondary | ICD-10-CM | POA: Diagnosis not present

## 2021-02-05 DIAGNOSIS — H2513 Age-related nuclear cataract, bilateral: Secondary | ICD-10-CM | POA: Diagnosis not present

## 2021-02-05 DIAGNOSIS — H35361 Drusen (degenerative) of macula, right eye: Secondary | ICD-10-CM | POA: Diagnosis not present

## 2021-02-05 DIAGNOSIS — H3540 Unspecified peripheral retinal degeneration: Secondary | ICD-10-CM | POA: Diagnosis not present

## 2021-02-05 DIAGNOSIS — D3132 Benign neoplasm of left choroid: Secondary | ICD-10-CM | POA: Diagnosis not present

## 2021-02-07 ENCOUNTER — Other Ambulatory Visit: Payer: Self-pay | Admitting: Obstetrics and Gynecology

## 2021-02-07 DIAGNOSIS — Z1231 Encounter for screening mammogram for malignant neoplasm of breast: Secondary | ICD-10-CM

## 2021-03-12 ENCOUNTER — Other Ambulatory Visit: Payer: Self-pay | Admitting: *Deleted

## 2021-03-12 DIAGNOSIS — E785 Hyperlipidemia, unspecified: Secondary | ICD-10-CM

## 2021-03-12 DIAGNOSIS — Z1231 Encounter for screening mammogram for malignant neoplasm of breast: Secondary | ICD-10-CM

## 2021-03-14 LAB — NMR, LIPOPROFILE
Cholesterol, Total: 263 mg/dL — ABNORMAL HIGH (ref 100–199)
HDL Particle Number: 43.7 umol/L (ref >=30.5)
HDL-C: 93 mg/dL (ref >39)
LDL Particle Number: 1737 nmol/L — ABNORMAL HIGH (ref <1000)
LDL Size: 21.7 nm (ref >20.5)
LDL-C (NIH Calc): 153 mg/dL — ABNORMAL HIGH (ref 0–99)
LP-IR Score: <25 (ref <=45)
Small LDL Particle Number: 322 nmol/L (ref <=527)
Triglycerides: 101 mg/dL (ref 0–149)

## 2021-03-14 LAB — LIPOPROTEIN A (LPA): Lipoprotein (a): 56.7 nmol/L (ref <75.0)

## 2021-03-14 LAB — APOLIPOPROTEIN B: Apolipoprotein B: 107 mg/dL — ABNORMAL HIGH (ref <90)

## 2021-03-20 ENCOUNTER — Encounter: Payer: Self-pay | Admitting: Internal Medicine

## 2021-03-20 ENCOUNTER — Ambulatory Visit (INDEPENDENT_AMBULATORY_CARE_PROVIDER_SITE_OTHER): Payer: Medicare Other | Admitting: Internal Medicine

## 2021-03-20 ENCOUNTER — Other Ambulatory Visit: Payer: Self-pay

## 2021-03-20 VITALS — BP 160/94 | HR 66 | Ht 61.0 in | Wt 130.8 lb

## 2021-03-20 DIAGNOSIS — E785 Hyperlipidemia, unspecified: Secondary | ICD-10-CM

## 2021-03-20 NOTE — Patient Instructions (Signed)
Medication Instructions:  Your physician recommends that you continue on your current medications as directed. Please refer to the Current Medication list given to you today.  *If you need a refill on your cardiac medications before your next appointment, please call your pharmacy*   Lab Work: none If you have labs (blood work) drawn today and your tests are completely normal, you will receive your results only by: MyChart Message (if you have MyChart) OR A paper copy in the mail If you have any lab test that is abnormal or we need to change your treatment, we will call you to review the results.   Testing/Procedures: none   Follow-Up: At [facility], you and your health needs are our priority.  As part of our continuing mission to provide you with exceptional heart care, we have created designated Provider Care Teams.  These Care Teams include your primary Cardiologist (physician) and Advanced Practice Providers (APPs -  Physician Assistants and Nurse Practitioners) who all work together to provide you with the care you need, when you need it.  We recommend signing up for the patient portal called "MyChart".  Sign up information is provided on this After Visit Summary.  MyChart is used to connect with patients for Virtual Visits (Telemedicine).  Patients are able to view lab/test results, encounter notes, upcoming appointments, etc.  Non-urgent messages can be sent to your provider as well.   To learn more about what you can do with MyChart, go to [email or web address].    

## 2021-03-20 NOTE — Progress Notes (Signed)
Cardiology Office Note   Date:  03/20/2021   ID:  Amber Escobar, Amber Escobar 1950/07/14, MRN [id number]  PCP:  Lyman Bishop, DO  Cardiologist:   Dorris Carnes, MD    Pt presents for f/u of palpitaitons    History of Present Illness: Amber Escobar is a 70 y.o. female with a history of palpitations and profound dyslipidemia.  I last saw the pt in clinic in 2021  Since seen she says she is doing OK  No CP   No dizziness   No SOB  Active    Gardens        Current Outpatient Medications  Medication Sig Dispense Refill   aspirin 81 MG tablet Take 81 mg by mouth daily.     calcium carbonate (OSCAL) 1500 (600 Ca) MG TABS tablet Take by mouth 2 (two) times daily with a meal.     Cholecalciferol (VITAMIN D-3) 1000 units CAPS Take 1,000 Units by mouth daily.     Coenzyme Q10 (COQ-10) 100 MG CAPS      estradiol (ESTRACE) 0.1 MG/GM vaginal cream Use 1/2 g vaginally every night for the first 2 weeks, then use 1/2 g vaginally two times a week. 42.5 g 0   Multiple Vitamin (MULTIVITAMIN) tablet Take 1 tablet by mouth daily.     Multiple Vitamins-Minerals (PRESERVISION AREDS 2) CAPS Take 1 tablet by mouth 2 (two) times daily.     Omega 3 1200 MG CAPS      pravastatin (PRAVACHOL) 20 MG tablet Take 1 tablet (20 mg total) by mouth every evening. Keep follow up in December for future refills. 90 tablet 0   ALPRAZolam (XANAX) 0.25 MG tablet Take 1 tablet by mouth as needed. (Patient not taking: No sig reported)     clobetasol ointment (TEMOVATE) 0.05 %      fluticasone (FLONASE) 50 MCG/ACT nasal spray      nystatin-triamcinolone (MYCOLOG II) cream Apply 1 application topically 2 (two) times daily. Apply to affected area BID for up to 7 days. (Patient not taking: No sig reported) 60 g 0   No current facility-administered medications for this visit.    Allergies:   Patient has no known allergies.   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 She has never used smokeless tobacco. She reports current alcohol use of about 7.0 - 14.0 standard drinks per week. She reports that she does not use drugs.   Family History:  The patient's family history includes COPD in her mother; CVA in her maternal grandfather, maternal grandmother, and mother; Cirrhosis in her paternal grandfather; Congestive Heart Failure in her father; Healthy in her brother; Hypertension  in her mother; Rheum arthritis in her father and paternal grandmother; Stroke in her mother.    ROS:  Please see the history of present illness. All other systems are reviewed and  Negative to the above problem except as noted.    PHYSICAL EXAM: VS:  BP (!) 160/94   Pulse 66   Ht 5\' 1"  (1.549 m)   Wt 130 lb 12.8 oz (59.3 kg)   LMP 04/14/2002   SpO2 96%   BMI 24.71 kg/m   GEN: Well nourished and  in no acute distress HEENT: normal Neck: no JVD, carotid bruits Cardiac: RRR; no murmurs.  No LE   edema  Respiratory:  clear to auscultation bilaterally GI: soft, nontender, nondistended, + BS  No hepatomegaly  MS: no deformity Moving all extremities   Skin: warm and dry, no rash Neuro:  Strength and sensation are intact Psych: euthymic mood, full affect   EKG:  EKG is ordered today.   NSR   First degree AV block  PR 202 msec   Lipid Panel    Component Value Date/Time   CHOL 259  (H) 06/19/2020 1006   TRIG 90 06/19/2020 1006   HDL 86 06/19/2020 1006   CHOLHDL 3.0 06/19/2020 1006   LDLCALC 158 (H) 06/19/2020 1006      Wt Readings from Last 3 Encounters:  03/20/21 130 lb 12.8 oz (59.3 kg)  12/26/20 131 lb (59.4 kg)  12/12/20 131 lb (59.4 kg)    CT Calcium Score    04/05/19 Calcium score 0   Mild atheroscleroiss of aorta   ASSESSMENT AND PLAN: 1  Palpitations Pt denies     2  HL   LDL has been as high as 207   Last lipomed recently LDL 153    HDL 93   Trig 101  particles 1737    The patient  had a calcium score CT in 2020  Score was 0   Thre was very mild atherosclerosis of aorta    Note this was second scan she had   Initial in 2000s was 0 as well    She has been on pravachol    Hx of liver cysts   Worried about liver metabolism I have reviewed with her   Now 2 CA scores   Lipids are not optimal   BUT again minimal aortic atheroclerosis  Keep on pravastatin   Plan CT calcium score 5 years from last     Eat good natural food   Limit carbs    Disposition:   FU in 1 year    Signed, Dorris Carnes, MD  03/20/2021 1:24 PM    [address], [place], [place]  [address] Phone: [phone]; Fax: [phone]

## 2021-04-10 DIAGNOSIS — S46819A Strain of other muscles, fascia and tendons at shoulder and upper arm level, unspecified arm, initial encounter: Secondary | ICD-10-CM | POA: Diagnosis not present

## 2021-04-10 DIAGNOSIS — J0140 Acute pansinusitis, unspecified: Secondary | ICD-10-CM | POA: Diagnosis not present

## 2021-04-16 ENCOUNTER — Other Ambulatory Visit: Payer: Self-pay | Admitting: Internal Medicine

## 2021-05-21 ENCOUNTER — Ambulatory Visit (INDEPENDENT_AMBULATORY_CARE_PROVIDER_SITE_OTHER): Payer: Medicare Other | Admitting: Nurse Practitioner

## 2021-05-21 ENCOUNTER — Encounter: Payer: Self-pay | Admitting: Nurse Practitioner

## 2021-05-21 ENCOUNTER — Other Ambulatory Visit: Payer: Self-pay

## 2021-05-21 VITALS — BP 122/74

## 2021-05-21 DIAGNOSIS — R140 Abdominal distension (gaseous): Secondary | ICD-10-CM

## 2021-05-21 DIAGNOSIS — R1030 Lower abdominal pain, unspecified: Secondary | ICD-10-CM | POA: Diagnosis not present

## 2021-05-21 NOTE — Progress Notes (Signed)
° °  Acute Office Visit  Subjective:    Patient ID: Amber Escobar, female    DOB: May 04, 1950, 71 y.o.   MRN: [id number]   HPI 71 y.o. presents today for bilateral lower abdominal pain x 1 month. She describes the pain as an aching and pressure. Initially the discomfort was infrequent but over the last couple weeks it is occurring more often. She is very active with exercise and does not notice it while working out. She notices it most when she is sitting. It was so bad the other day it was painful to walk. Denies vaginal bleeding, discharge, urinary symptoms, changes in bowels other than some bloating. She had colonoscopy in September and the prep was very difficult for her so she is worried it could have affected something. Tubular adenomas found. Midurethral sling procedure in 2008.    Review of Systems  Constitutional: Negative.   Gastrointestinal:  Positive for abdominal distention and abdominal pain. Negative for constipation, diarrhea, nausea and vomiting.  Genitourinary:  Positive for pelvic pain. Negative for difficulty urinating, dysuria, flank pain, frequency, hematuria, urgency, vaginal bleeding and vaginal discharge.      Objective:    Physical Exam Constitutional:      Appearance: Normal appearance.  Abdominal:     Tenderness: There is abdominal tenderness (L>R) in the right lower quadrant and left lower quadrant. There is no guarding or rebound.  Genitourinary:    General: Normal vulva.     Vagina: Normal.     Cervix: Normal.     Uterus: With uterine prolapse (grade 1).     BP 122/74    LMP 04/14/2002  Wt Readings from Last 3 Encounters:  03/20/21 130 lb 12.8 oz (59.3 kg)  12/26/20 131 lb (59.4 kg)  12/12/20 131 lb (59.4 kg)        Assessment & Plan:   Problem List Items Addressed This Visit   None Visit Diagnoses     Lower abdominal pain    -  Primary   Relevant Orders   US PELVIS TRANSVAGINAL NON-OB (TV ONLY)   Abdominal bloating       Relevant  Orders   US PELVIS TRANSVAGINAL NON-OB (TV ONLY)      Plan: Will schedule pelvic ultrasound for further evaluation.      [place], 4:05 PM 05/21/2021

## 2021-05-29 NOTE — Progress Notes (Signed)
71 y.o. G29P2002 Married Caucasian female here for annual breast and pelvic exam.  Patient is followed for vaginal atrophy and osteoporosis.  Seen for pelvic pain at this office this last week.  Her pain resolved after seeing her massage therapist.  She wants to cancer her pelvic US.  No bladder leakage with exercise.   Using vaginal estradiol and does not need any refills now.   She requests Mycolog for the vulva.  She has chronic vulvitis.  She uses this when the weather is warmer.  Currently not treating osteoporosis.   PCP:  Crissie Sickles, DO  Patient's last menstrual period was 04/14/2002.           Sexually active: No.  The current method of family planning is post menopausal status.    Exercising: Yes.     Cardio, strength and balance 3x/week Smoker:  no  Health Maintenance: Pap:  05-23-19 Neg, 03-18-17 Neg, 02-23-15 Neg:Neg HR HPV History of abnormal Pap:  no MMG:  05-30-20 Neg/BiRads1.  Has appointment for this.  Colonoscopy:  12-26-20 polyps removed;next 7 years BMD:  08-06-20  Result :Osteoporosis of hip TDaP: PCP Gardasil:   n/a HIV: Neg years ago Hep C: 2017 Neg Screening Labs:  PCP   reports that she has never smoked. She has never used smokeless tobacco. She reports current alcohol use of about 7.0 - 14.0 standard drinks per week. She reports that she does not use drugs.  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     calcium carbonate (OSCAL) 1500 (600 Ca) MG TABS tablet Take by mouth 2 (two) times daily with a meal.     Cholecalciferol (VITAMIN D-3) 1000 units CAPS Take 1,000 Units by mouth daily.     Coenzyme Q10 (COQ-10) 100 MG CAPS      estradiol (ESTRACE) 0.1 MG/GM vaginal cream Use 1/2 g vaginally every night for the first 2 weeks, then use 1/2 g vaginally two times a week. 42.5 g 0   Multiple Vitamin (MULTIVITAMIN) tablet Take 1 tablet by mouth daily.     Multiple Vitamins-Minerals (PRESERVISION AREDS 2) CAPS Take 1 tablet by mouth 2 (two) times daily.     Omega 3 1200 MG CAPS      pravastatin (PRAVACHOL) 20 MG tablet Take 1 tablet (20 mg total) by mouth every evening. 90 tablet 3   No current facility-administered medications for this visit.    Family History  Problem Relation Age of Onset   Hypertension Mother    CVA Mother    Stroke Mother    COPD Mother    Rheum arthritis Father  Congestive Heart Failure Father    Healthy Brother    CVA Maternal Grandmother    CVA Maternal Grandfather    Rheum arthritis Paternal Grandmother    Cirrhosis Paternal Grandfather    Colon cancer Neg Hx    Colon polyps Neg Hx    Esophageal cancer Neg Hx    Rectal cancer Neg Hx    Stomach cancer Neg Hx     Review of Systems  All other systems reviewed and are negative.  Exam:   BP 136/74    Pulse 80    Ht 5\' 1"  (1.549 m)    Wt 130 lb (59 kg)    LMP 04/14/2002    SpO2 98%    BMI 24.56 kg/m     General appearance: alert, cooperative and appears stated age Head: normocephalic, without obvious abnormality, atraumatic Neck: no adenopathy, supple, symmetrical, trachea midline and thyroid normal to inspection and palpation Lungs: clear to auscultation bilaterally Breasts: normal appearance, no masses or tenderness, No nipple retraction or dimpling, No nipple  discharge or bleeding, No axillary adenopathy Heart: regular rate and rhythm Abdomen: soft, non-tender; no masses, no organomegaly Extremities: extremities normal, atraumatic, no cyanosis or edema Skin: skin color, texture, turgor normal. No rashes or lesions Lymph nodes: cervical, supraclavicular, and axillary nodes normal. Neurologic: grossly normal  Pelvic: External genitalia:  no lesions              No abnormal inguinal nodes palpated.              Urethra:  normal appearing urethra with no masses, tenderness or lesions              Bartholins and Skenes: normal                 Vagina: normal appearing vagina with normal color and discharge, no lesions              Cervix: no lesions              Pap taken: yes Bimanual Exam:  Uterus:  normal size, contour, position, consistency, mobility, non-tender              Adnexa: no mass, fullness, tenderness              Rectal exam: declined.   Chaperone was present for exam:  Joy, CMA  Assessment:   Well woman visit with gynecologic exam. Pelvic pain resolved.  Urogenital atrophy.  Probable small fibroid on CT scan.  Hx midurethral sling.  Osteoporosis.   Plan: Mammogram screening discussed. Self breast awareness reviewed. Pap and HR HPV as above. Guidelines for Calcium, Vitamin D, regular exercise program including cardiovascular and weight bearing exercise. Ok to cancell pelvic ultrasound appointment.  She will contact me for a prescription for vaginal estrogen when needed.  I discussed potential effect on breast cancer.  Rx for Mycolog II.  BMD next year.  Follow up annually and prn.   After visit summary provided.   22 min  total time was spent for this patient encounter, including preparation, face-to-face counseling with the patient, coordination of care, and documentation of the encounter.

## 2021-05-30 ENCOUNTER — Other Ambulatory Visit (HOSPITAL_COMMUNITY)
Admission: RE | Admit: 2021-05-30 | Discharge: 2021-05-30 | Disposition: A | Discharge status: Home / Self Care | Payer: Medicare Other | Source: Ambulatory Visit | Attending: Obstetrics and Gynecology | Admitting: Obstetrics and Gynecology

## 2021-05-30 ENCOUNTER — Ambulatory Visit (INDEPENDENT_AMBULATORY_CARE_PROVIDER_SITE_OTHER): Payer: Medicare Other | Admitting: Obstetrics and Gynecology

## 2021-05-30 ENCOUNTER — Encounter: Payer: Self-pay | Admitting: Obstetrics and Gynecology

## 2021-05-30 ENCOUNTER — Other Ambulatory Visit: Payer: Self-pay

## 2021-05-30 VITALS — BP 136/74 | HR 80 | Ht 61.0 in | Wt 130.0 lb

## 2021-05-30 DIAGNOSIS — Z1151 Encounter for screening for human papillomavirus (HPV): Secondary | ICD-10-CM | POA: Diagnosis not present

## 2021-05-30 DIAGNOSIS — Z124 Encounter for screening for malignant neoplasm of cervix: Secondary | ICD-10-CM

## 2021-05-30 DIAGNOSIS — N763 Subacute and chronic vulvitis: Secondary | ICD-10-CM

## 2021-05-30 DIAGNOSIS — Z01419 Encounter for gynecological examination (general) (routine) without abnormal findings: Secondary | ICD-10-CM

## 2021-05-30 MED ORDER — NYSTATIN-TRIAMCINOLONE 100000-0.1 UNIT/GM-% EX CREA: 1.0000 "application " | TOPICAL_CREAM | Freq: Two times a day (BID) | CUTANEOUS | 0 refills | Status: AC

## 2021-05-30 NOTE — Patient Instructions (Signed)

## 2021-05-31 ENCOUNTER — Ambulatory Visit
Admission: RE | Admit: 2021-05-31 | Discharge: 2021-05-31 | Disposition: A | Discharge status: Home / Self Care | Payer: Medicare Other | Source: Ambulatory Visit | Attending: Obstetrics and Gynecology | Admitting: Obstetrics and Gynecology

## 2021-05-31 DIAGNOSIS — Z1231 Encounter for screening mammogram for malignant neoplasm of breast: Secondary | ICD-10-CM | POA: Diagnosis not present

## 2021-05-31 LAB — CYTOLOGY - PAP
Comment: NEGATIVE
Diagnosis: NEGATIVE
High risk HPV: NEGATIVE

## 2021-06-01 ENCOUNTER — Encounter: Payer: Self-pay | Admitting: Obstetrics and Gynecology

## 2021-06-06 ENCOUNTER — Other Ambulatory Visit: Payer: PRIVATE HEALTH INSURANCE | Admitting: Obstetrics and Gynecology

## 2021-06-06 ENCOUNTER — Other Ambulatory Visit: Payer: Medicare Other

## 2021-06-14 DIAGNOSIS — U071 COVID-19: Secondary | ICD-10-CM | POA: Diagnosis not present

## 2021-08-06 DIAGNOSIS — Z1211 Encounter for screening for malignant neoplasm of colon: Secondary | ICD-10-CM | POA: Diagnosis not present

## 2021-08-06 DIAGNOSIS — J309 Allergic rhinitis, unspecified: Secondary | ICD-10-CM | POA: Diagnosis not present

## 2021-08-06 DIAGNOSIS — Z862 Personal history of diseases of the blood and blood-forming organs and certain disorders involving the immune mechanism: Secondary | ICD-10-CM | POA: Diagnosis not present

## 2021-08-06 DIAGNOSIS — Z13820 Encounter for screening for osteoporosis: Secondary | ICD-10-CM | POA: Diagnosis not present

## 2021-08-06 DIAGNOSIS — Z23 Encounter for immunization: Secondary | ICD-10-CM | POA: Diagnosis not present

## 2021-08-06 DIAGNOSIS — E785 Hyperlipidemia, unspecified: Secondary | ICD-10-CM | POA: Diagnosis not present

## 2021-08-06 DIAGNOSIS — Z1231 Encounter for screening mammogram for malignant neoplasm of breast: Secondary | ICD-10-CM | POA: Diagnosis not present

## 2021-08-06 DIAGNOSIS — Z0000 Encounter for general adult medical examination without abnormal findings: Secondary | ICD-10-CM | POA: Diagnosis not present

## 2021-10-03 ENCOUNTER — Other Ambulatory Visit: Payer: Self-pay | Admitting: Obstetrics and Gynecology

## 2021-10-03 ENCOUNTER — Encounter: Payer: Self-pay | Admitting: Obstetrics and Gynecology

## 2021-10-03 DIAGNOSIS — Z1231 Encounter for screening mammogram for malignant neoplasm of breast: Secondary | ICD-10-CM

## 2021-10-04 ENCOUNTER — Other Ambulatory Visit: Payer: Self-pay | Admitting: Obstetrics and Gynecology

## 2021-10-04 DIAGNOSIS — M810 Age-related osteoporosis without current pathological fracture: Secondary | ICD-10-CM

## 2021-12-03 ENCOUNTER — Encounter: Payer: Self-pay | Admitting: Gastroenterology

## 2021-12-03 ENCOUNTER — Ambulatory Visit (INDEPENDENT_AMBULATORY_CARE_PROVIDER_SITE_OTHER): Payer: Medicare Other | Admitting: Gastroenterology

## 2021-12-03 VITALS — BP 136/78 | HR 84 | Ht 60.63 in | Wt 132.5 lb

## 2021-12-03 DIAGNOSIS — R109 Unspecified abdominal pain: Secondary | ICD-10-CM | POA: Diagnosis not present

## 2021-12-03 DIAGNOSIS — R194 Change in bowel habit: Secondary | ICD-10-CM

## 2021-12-03 DIAGNOSIS — K7689 Other specified diseases of liver: Secondary | ICD-10-CM | POA: Diagnosis not present

## 2021-12-03 MED ORDER — DOXYCYCLINE HYCLATE 100 MG PO CAPS
100.0000 mg | ORAL_CAPSULE | Freq: Two times a day (BID) | ORAL | 0 refills | Status: AC
Start: 2021-12-03 — End: 2021-12-17

## 2021-12-03 NOTE — Progress Notes (Unsigned)
Referring Provider: Lyman Bishop, DO Primary Care Physician:  Lyman Bishop, DO   Reason for Consultation:  Abdominal pain, gas bloating, diarrhea   IMPRESSION:  Abdominal pain Altered bowel habits Abnormal umbilicus Benign liver cyst on prior imaging   History of colon polyps    -2 tubular adenomas on colonoscopy 2016    -2 tubular adenomas on colonoscopy 2022    -Surveillance colonoscopy recommended in 7 years  PLAN: Abdominal ultrasound Doxycycline 100 mg BID x 14 days Follow-up by MyChart in 4-6 weeks later Will keep a stool diary after completing antibiotics    HPI: Amber Escobar is a 71 y.o. female who is seen in the office today.  This is my first office visit with Amber Escobar.  The history is obtained through the patient and review of her electronic health record.  I met her at the time of a surveillance colonoscopy in September 2022. Prior  GI care with Dr. Paulita Fujita at [facility] gastroenterology. She last saw Dr. Paulita Fujita 06/2021.  She has a history of hyperlipidemia, allergic rhinitis, anxiety, and anemia.  One year of abdominal pain and erythema at her umbilicus, worsened over the last year.  Malodorous.  She was not previously evaluated by her PCP for these symptoms.  Recent GYN evaluation was negative  Chiropractor and massage therapist told her that her hip flexors were tight.   Today is the first day she has felt well in 5 days.   Peppermint, fennel, and licorice.  Eercise 3 times a week at a church in [employer] equivalent. The symptoms have started to disrupted her routine.   No prior abdominal imaging.   Endoscopic history: The first colonoscopy was performed on 01/19/2010 and the second on 03/01/2015. Colonoscopy 03/01/2015 for a personal history of colon polyps revealed internal hemorrhoids, a 3 mm tubular adenoma in the cecum, and a 6 mm tubular adenoma in the ascending colon.  Surveillance colonoscopy recommended in 5  years.  Colonoscopy with me 12/26/20: 2 mm polyp at the hepatic flexure and a 4 mm polyp in the ascending colon, removed with a cold snare.  Pathology revealed 2 tubular adenomas.  Surveillance recommended in 7 years.   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     calcium carbonate (OSCAL) 1500 (600 Ca) MG TABS tablet Take by mouth 2 (two) times daily with a meal.     Cholecalciferol (VITAMIN D-3) 1000 units CAPS Take 1,000 Units by mouth daily.     Coenzyme Q10 (COQ-10) 100 MG CAPS      estradiol (ESTRACE) 0.1 MG/GM vaginal cream Use 1/2 g  vaginally every night for the first 2 weeks, then use 1/2 g vaginally two times a week. 42.5 g 0   fluticasone (FLONASE) 50 MCG/ACT nasal spray Place 1 spray into both nostrils as needed for allergies or rhinitis.     Multiple Vitamin (MULTIVITAMIN) tablet Take 1 tablet by mouth daily.     Multiple Vitamins-Minerals (PRESERVISION AREDS 2) CAPS Take 1 tablet by mouth 2 (two) times daily.     nystatin-triamcinolone (MYCOLOG II) cream Apply 1 application topically 2 (two) times daily. Apply to affected area BID for up to 7 days. 60 g 0   Omega-3 Fatty Acids (OMEGA 3 PO) Take 3,000 mg by mouth daily.     pravastatin (PRAVACHOL) 20 MG tablet Take 1 tablet (20 mg total) by  mouth every evening. 90 tablet 3   clobetasol ointment (TEMOVATE) 5.17 % Apply 1 Application topically as needed.     No current facility-administered medications for this visit.    Allergies as of 12/03/2021   (No Known Allergies)    Family History  Problem Relation Age of Onset   Hypertension Mother    CVA Mother    Stroke Mother    COPD Mother    Rheum arthritis Father    Congestive Heart Failure Father    Healthy Brother    CVA Maternal Grandmother    CVA Maternal Grandfather    Rheum arthritis Paternal Grandmother    Cirrhosis Paternal Grandfather    Colon cancer Neg Hx    Colon polyps Neg Hx    Esophageal cancer Neg Hx    Rectal cancer Neg Hx    Stomach cancer Neg Hx     Social History   Socioeconomic History   Marital status: Married    Spouse name: Not on file   Number of children: 2   Years of education: Not on file   Highest education level: Not on file  Occupational History   Occupation: MEDICAL BILLER   Occupation: retired  Tobacco Use   Smoking status: Never   Smokeless tobacco: Never  Vaping Use   Vaping Use: Never used  Substance and Sexual Activity   Alcohol use: Yes    Alcohol/week: 7.0 - 14.0 standard drinks of alcohol    Types: 7 - 14 Glasses of wine per week    Comment: 1-2 daily   Drug use: No   Sexual activity: Not Currently    Partners: Male    Birth control/protection: Post-menopausal  Other Topics Concern   Not on file  Social History Narrative   Not on file   Social Determinants of Health   Financial Resource Strain: Not on file  Food Insecurity: Not on file  Transportation Needs: Not on file  Physical Activity: Not on file  Stress: Not on file  Social Connections: Not on file  Intimate Partner Violence: Not on file    Review of Systems: 12 system ROS is negative except as noted above.   Physical Exam: General:   Alert,  well-nourished, pleasant and cooperative in NAD Head:  Normocephalic and atraumatic. Eyes:   Sclera clear, no icterus.   Conjunctiva pink. Ears:  Normal auditory acuity. Nose:  No deformity, discharge,  or lesions. Mouth:  No deformity or lesions.   Neck:  Supple; no masses or thyromegaly. Lungs:  Clear throughout to auscultation.   No wheezes. Heart:  Regular rate and rhythm; no murmurs. Abdomen:  Soft, nontender, nondistended, normal bowel sounds, no rebound or guarding. No hepatosplenomegaly.   Rectal:  Deferred  Msk:  Symmetrical. No boney deformities LAD: No inguinal or umbilical LAD Extremities:  No clubbing or edema. Neurologic:  Alert and  oriented x4;  grossly nonfocal Skin:  Intact without significant lesions or rashes. Psych:  Alert and cooperative. Normal mood and affect.     Arval Brandstetter L. Tarri Glenn, MD, MPH 12/03/2021, 3:43 PM

## 2021-12-03 NOTE — Patient Instructions (Signed)
It was my pleasure to provide care to you today. Based on our discussion, I am providing you with my recommendations below:  RECOMMENDATION(S):   Start a stool diary after completing antibiotics.  PRESCRIPTION MEDICATION(S):   We have sent the following medication(s) to your pharmacy:  Doxycycline 100 mg twice daily for 14 days.   ABDOMINAL ULTRASOUND:  You have been scheduled for an abdominal ultrasound at [facility] (1st floor of the hospital) on Friday 12/09/21 at 8 am. Please arrive @ 7:45 am for registration.   PREP:   Please DO NOT eat or drink anything 6 hours prior to your test.  NEED TO RESCHEDULE?   Please call radiology at [phone].  FOLLOW UP:  I would like for you to follow up with me via MyChart in 4-6 weeks with an update.   BMI:  If you are age 32 or older, your body mass index should be between 23-30. Your Body mass index is 25.34 kg/m. If this is out of the aforementioned range listed, please consider follow up with your Primary Care Provider.  MY CHART:  The [place] [facility] providers would like to encourage you to use [facility] to communicate with providers for non-urgent requests or questions.  Due to long hold times on the telephone, sending your provider a message by [facility] may be a faster and more efficient way to get a response.  Please allow 48 business hours for a response.  Please remember that this is for non-urgent requests.   Thank you for trusting me with your gastrointestinal care!    Thornton Park, MD, MPH

## 2021-12-04 ENCOUNTER — Encounter: Payer: Self-pay | Admitting: Gastroenterology

## 2021-12-04 DIAGNOSIS — D3132 Benign neoplasm of left choroid: Secondary | ICD-10-CM | POA: Diagnosis not present

## 2021-12-04 DIAGNOSIS — H04129 Dry eye syndrome of unspecified lacrimal gland: Secondary | ICD-10-CM | POA: Diagnosis not present

## 2021-12-04 DIAGNOSIS — H2513 Age-related nuclear cataract, bilateral: Secondary | ICD-10-CM | POA: Diagnosis not present

## 2021-12-06 ENCOUNTER — Ambulatory Visit (HOSPITAL_COMMUNITY)
Admission: RE | Admit: 2021-12-06 | Discharge: 2021-12-06 | Disposition: A | Discharge status: Home / Self Care | Payer: Medicare Other | Source: Ambulatory Visit | Attending: Gastroenterology | Admitting: Gastroenterology

## 2021-12-06 DIAGNOSIS — R109 Unspecified abdominal pain: Secondary | ICD-10-CM | POA: Diagnosis not present

## 2021-12-06 DIAGNOSIS — K7689 Other specified diseases of liver: Secondary | ICD-10-CM | POA: Diagnosis not present

## 2021-12-06 DIAGNOSIS — R194 Change in bowel habit: Secondary | ICD-10-CM | POA: Diagnosis not present

## 2021-12-06 DIAGNOSIS — N281 Cyst of kidney, acquired: Secondary | ICD-10-CM | POA: Diagnosis not present

## 2021-12-12 ENCOUNTER — Other Ambulatory Visit: Payer: Self-pay

## 2021-12-12 DIAGNOSIS — R198 Other specified symptoms and signs involving the digestive system and abdomen: Secondary | ICD-10-CM

## 2021-12-12 DIAGNOSIS — R109 Unspecified abdominal pain: Secondary | ICD-10-CM

## 2021-12-18 ENCOUNTER — Encounter: Payer: Self-pay | Admitting: Gastroenterology

## 2021-12-19 ENCOUNTER — Telehealth: Payer: Self-pay

## 2021-12-19 ENCOUNTER — Ambulatory Visit (HOSPITAL_BASED_OUTPATIENT_CLINIC_OR_DEPARTMENT_OTHER)
Admission: RE | Admit: 2021-12-19 | Discharge: 2021-12-19 | Disposition: A | Discharge status: Home / Self Care | Payer: Medicare Other | Source: Ambulatory Visit | Attending: Gastroenterology | Admitting: Gastroenterology

## 2021-12-19 ENCOUNTER — Other Ambulatory Visit: Payer: Self-pay | Admitting: Gastroenterology

## 2021-12-19 DIAGNOSIS — R109 Unspecified abdominal pain: Secondary | ICD-10-CM | POA: Insufficient documentation

## 2021-12-19 DIAGNOSIS — R198 Other specified symptoms and signs involving the digestive system and abdomen: Secondary | ICD-10-CM

## 2021-12-19 NOTE — Telephone Encounter (Signed)
Received call from [facility] stating US order needed to be changed to limited due to the location. Order has been update per Manuela Schwartz.

## 2021-12-20 ENCOUNTER — Encounter: Payer: Self-pay | Admitting: Gastroenterology

## 2021-12-20 ENCOUNTER — Other Ambulatory Visit: Payer: Self-pay

## 2021-12-20 DIAGNOSIS — R109 Unspecified abdominal pain: Secondary | ICD-10-CM

## 2021-12-20 DIAGNOSIS — R198 Other specified symptoms and signs involving the digestive system and abdomen: Secondary | ICD-10-CM

## 2021-12-20 DIAGNOSIS — R9389 Abnormal findings on diagnostic imaging of other specified body structures: Secondary | ICD-10-CM

## 2021-12-27 ENCOUNTER — Encounter (HOSPITAL_BASED_OUTPATIENT_CLINIC_OR_DEPARTMENT_OTHER): Payer: Self-pay

## 2021-12-27 ENCOUNTER — Ambulatory Visit (HOSPITAL_BASED_OUTPATIENT_CLINIC_OR_DEPARTMENT_OTHER)
Admission: RE | Admit: 2021-12-27 | Discharge: 2021-12-27 | Disposition: A | Discharge status: Home / Self Care | Payer: Medicare Other | Source: Ambulatory Visit | Attending: Gastroenterology | Admitting: Gastroenterology

## 2021-12-27 DIAGNOSIS — K7689 Other specified diseases of liver: Secondary | ICD-10-CM | POA: Diagnosis not present

## 2021-12-27 DIAGNOSIS — R9389 Abnormal findings on diagnostic imaging of other specified body structures: Secondary | ICD-10-CM | POA: Diagnosis not present

## 2021-12-27 DIAGNOSIS — R109 Unspecified abdominal pain: Secondary | ICD-10-CM | POA: Insufficient documentation

## 2021-12-27 DIAGNOSIS — R198 Other specified symptoms and signs involving the digestive system and abdomen: Secondary | ICD-10-CM | POA: Diagnosis not present

## 2021-12-27 DIAGNOSIS — R1905 Periumbilic swelling, mass or lump: Secondary | ICD-10-CM | POA: Diagnosis not present

## 2021-12-27 MED ORDER — IOHEXOL 300 MG/ML  SOLN
100.0000 mL | Freq: Once | INTRAMUSCULAR | Status: AC | PRN
Start: 2021-12-27 — End: 2021-12-27
  Administered 2021-12-27: 100 mL via INTRAVENOUS

## 2021-12-30 ENCOUNTER — Telehealth: Payer: Self-pay | Admitting: Gastroenterology

## 2021-12-30 NOTE — Telephone Encounter (Signed)
Patient is returning your call, regarding results Thanks.

## 2021-12-30 NOTE — Telephone Encounter (Signed)
Refer to imaging result note 12/28/21.

## 2022-01-08 DIAGNOSIS — Z23 Encounter for immunization: Secondary | ICD-10-CM | POA: Diagnosis not present

## 2022-01-12 ENCOUNTER — Encounter: Payer: Self-pay | Admitting: Gastroenterology

## 2022-01-14 DIAGNOSIS — B372 Candidiasis of skin and nail: Secondary | ICD-10-CM | POA: Diagnosis not present

## 2022-01-22 DIAGNOSIS — Z23 Encounter for immunization: Secondary | ICD-10-CM | POA: Diagnosis not present

## 2022-02-24 ENCOUNTER — Telehealth: Payer: Self-pay | Admitting: Internal Medicine

## 2022-02-24 DIAGNOSIS — Z79899 Other long term (current) drug therapy: Secondary | ICD-10-CM

## 2022-02-24 DIAGNOSIS — E785 Hyperlipidemia, unspecified: Secondary | ICD-10-CM

## 2022-02-24 NOTE — Telephone Encounter (Signed)
Called pt advised that Dr. Harrington Challenger and RN are not in the office today.  I will send message to be addressed upon return.  Pt thanked me for call.

## 2022-02-24 NOTE — Telephone Encounter (Signed)
Patient wants to know if she will need to do blood work prior to her Jan 4 appointment.

## 2022-02-26 DIAGNOSIS — Z79899 Other long term (current) drug therapy: Secondary | ICD-10-CM

## 2022-02-26 DIAGNOSIS — E785 Hyperlipidemia, unspecified: Secondary | ICD-10-CM

## 2022-02-26 NOTE — Telephone Encounter (Signed)
Sent My Chart to the pt requesting a lab date.

## 2022-02-26 NOTE — Telephone Encounter (Signed)
Patient should have a lipomed, CMET, CBC and A1C

## 2022-03-20 ENCOUNTER — Encounter: Payer: Self-pay | Admitting: Internal Medicine

## 2022-03-27 ENCOUNTER — Other Ambulatory Visit: Payer: Self-pay | Admitting: *Deleted

## 2022-03-27 ENCOUNTER — Ambulatory Visit: Payer: Medicare Other | Attending: Internal Medicine

## 2022-03-27 DIAGNOSIS — Z79899 Other long term (current) drug therapy: Secondary | ICD-10-CM | POA: Diagnosis not present

## 2022-03-27 DIAGNOSIS — E785 Hyperlipidemia, unspecified: Secondary | ICD-10-CM | POA: Diagnosis not present

## 2022-03-27 NOTE — Progress Notes (Signed)
Lab orders placed on this pt to have done in our office now, as ordered by Dr. Harrington Challenger.   Lab orders placed were cmet, cbc, nmr lipoprofile, and A1c.   Katrina in lab will draw on the pt now.

## 2022-03-29 LAB — COMPREHENSIVE METABOLIC PANEL
ALT: 29 IU/L (ref 0–32)
AST: 24 IU/L (ref 0–40)
Albumin/Globulin Ratio: 1.6 (ref 1.2–2.2)
Albumin: 4.2 g/dL (ref 3.9–4.9)
Alkaline Phosphatase: 79 IU/L (ref 44–121)
BUN/Creatinine Ratio: 23 (ref 12–28)
BUN: 14 mg/dL (ref 8–27)
Bilirubin Total: 0.3 mg/dL (ref 0.0–1.2)
CO2: 27 mmol/L (ref 20–29)
Calcium: 9.2 mg/dL (ref 8.7–10.3)
Chloride: 103 mmol/L (ref 96–106)
Creatinine, Ser: 0.6 mg/dL (ref 0.57–1.00)
Globulin, Total: 2.7 g/dL (ref 1.5–4.5)
Glucose: 88 mg/dL (ref 70–99)
Potassium: 4.2 mmol/L (ref 3.5–5.2)
Sodium: 141 mmol/L (ref 134–144)
Total Protein: 6.9 g/dL (ref 6.0–8.5)
eGFR: 96 mL/min/{1.73_m2} (ref >59)

## 2022-03-29 LAB — CBC
Hematocrit: 39.9 % (ref 34.0–46.6)
Hemoglobin: 13.5 g/dL (ref 11.1–15.9)
MCH: 30.6 pg (ref 26.6–33.0)
MCHC: 33.8 g/dL (ref 31.5–35.7)
MCV: 91 fL (ref 79–97)
Platelets: 302 10*3/uL (ref 150–450)
RBC: 4.41 x10E6/uL (ref 3.77–5.28)
RDW: 13.1 % (ref 11.7–15.4)
WBC: 3.3 10*3/uL — ABNORMAL LOW (ref 3.4–10.8)

## 2022-03-29 LAB — NMR, LIPOPROFILE
Cholesterol, Total: 299 mg/dL — ABNORMAL HIGH (ref 100–199)
HDL Particle Number: 43.1 umol/L (ref >=30.5)
HDL-C: 105 mg/dL (ref >39)
LDL Particle Number: 1883 nmol/L — ABNORMAL HIGH (ref <1000)
LDL Size: 22 nm (ref >20.5)
LDL-C (NIH Calc): 180 mg/dL — ABNORMAL HIGH (ref 0–99)
LP-IR Score: <25 (ref <=45)
Small LDL Particle Number: <90 nmol/L (ref <=527)
Triglycerides: 90 mg/dL (ref 0–149)

## 2022-03-29 LAB — HEMOGLOBIN A1C
Est. average glucose Bld gHb Est-mCnc: 114 mg/dL
Hgb A1c MFr Bld: 5.6 % (ref 4.8–5.6)

## 2022-04-03 ENCOUNTER — Other Ambulatory Visit: Payer: Self-pay | Admitting: Internal Medicine

## 2022-04-15 NOTE — Progress Notes (Unsigned)
Cardiology Office Note   Date:  04/17/2022   ID:  Jaydon, [place] Nov 10, 1950, MRN [id number]  PCP:  Lyman Bishop, DO  Cardiologist:   Dorris Carnes, MD    Pt presents for f/u of palpitaiton and HL      History of Present Illness: Amber Escobar is a 72 y.o. female with a history of palpitations and profound dyslipidemia. I saw the pt in Dec 2022  Since I saw her she has stoppped the pravastatin   She says last summer she started getting achy , weak    Since stopping it she has felt better     She is very active   Works out regularly     No CP  No dizziness NO SOB  No palpitatons    Current Outpatient Medications  Medication Sig Dispense Refill   aspirin 81 MG tablet Take 81 mg by mouth daily.     calcium carbonate (OSCAL) 1500 (600 Ca) MG TABS tablet Take by mouth 2 (two) times daily with a meal.     Cholecalciferol (VITAMIN D-3) 1000 units CAPS Take 1,000 Units by mouth daily.     clobetasol ointment (TEMOVATE) 2.29 % Apply 1 Application topically as needed.     Coenzyme Q10 (COQ-10) 100 MG CAPS      estradiol (ESTRACE) 0.1 MG/GM vaginal cream Use 1/2 g vaginally every night for the first 2 weeks, then use 1/2 g vaginally two times a week. 42.5 g 0   fluticasone (FLONASE) 50 MCG/ACT nasal spray Place 1 spray into both nostrils as needed for allergies or rhinitis.     Multiple Vitamin (MULTIVITAMIN) tablet Take 1 tablet by mouth daily.     Multiple Vitamins-Minerals (PRESERVISION AREDS 2) CAPS Take 1 tablet by mouth 2 (two) times daily.     nystatin-triamcinolone (MYCOLOG II) cream Apply 1 application topically 2 (two) times daily. Apply to affected area BID for up to 7 days. 60 g 0   Omega-3 Fatty Acids (OMEGA 3 PO) Take 3,000 mg by mouth daily.     pravastatin (PRAVACHOL) 20 MG tablet Take 1 tablet (20 mg total) by mouth daily. Please keep upcoming appt.with Dr. Harrington Challenger in Jan in order to receive future refills. Thank You. (Patient not taking: Reported on  04/17/2022) 90 tablet 0   No current facility-administered medications for this visit.    Allergies:   Patient has no known allergies.   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 She has never used smokeless tobacco. She reports current alcohol use of about 7.0 - 14.0 standard drinks of alcohol per week. She reports that she does not use drugs.  Family History:  The patient's family history includes COPD in her mother; CVA in her maternal grandfather, maternal grandmother, and mother; Cirrhosis in her paternal grandfather; Congestive Heart Failure in her father; Healthy in her brother; Hypertension in her mother; Rheum arthritis in her father and paternal grandmother; Stroke in her mother.    ROS:  Please see the history of present illness. All other systems are reviewed and  Negative to the above problem except as noted.    PHYSICAL EXAM: VS:  BP 136/82   Pulse 79   Ht '5\' 1"'$  (1.549 m)   Wt 136 lb 12.8 oz (62.1 kg)   LMP 04/14/2002   SpO2 98%   BMI 25.85 kg/m   GEN: Well nourished and  in no acute distress HEENT: normal Neck: no JVD, carotid bruit Cardiac: RRR; no murmurs.  No LE edema  Respiratory:  clear to auscultation bilaterally GI: soft, nontender, nondistended, + BS  No hepatomegaly  MS: no deformity Moving all extremities   Skin:  warm and dry, no rash Neuro:  Strength and sensation are intact Psych: euthymic mood, full affect   EKG:  EKG is ordered today.   NSR  70 bpm    Lipid Panel    Component Value Date/Time   CHOL 259 (H) 06/19/2020 1006   TRIG 90 06/19/2020 1006   HDL 86 06/19/2020 1006   CHOLHDL 3.0 06/19/2020 1006   LDLCALC 158 (H) 06/19/2020 1006      Wt Readings from Last 3 Encounters:  04/17/22 136 lb 12.8 oz (62.1 kg)  12/03/21 132 lb 8 oz (60.1 kg)  05/30/21 130 lb (59 kg)    CT Calcium Score    04/05/19 Calcium score 0   Mild atheroscleroiss of aorta   ASSESSMENT AND PLAN:  1  HL   Pt is off of statin    LDL has been as high as 207  The patient  had a calcium score CT in 2020  Score was 0   Thre was very mild atherosclerosis of aorta   Abdominal CT in Sept 2023 showed no obvious calcifications    She will follow up with lipids in the summer    No meds for now    Probably follow up Calcium score CT at some pt    2 Hx palpitatons   Pt asymptomatic  3  Blood pressure   BP a little elvated today   Much better at home    110s / 120s/  Plan  labs in July lipomed, ApoB  and A1C      Disposition:   FU in  clinic 1 year    Signed, Dorris Carnes, MD  04/17/2022 2:57 PM    [address], [place], [place]  [address] Phone: [phone]; Fax: [phone]

## 2022-04-17 ENCOUNTER — Ambulatory Visit: Payer: Medicare Other | Attending: Internal Medicine | Admitting: Internal Medicine

## 2022-04-17 ENCOUNTER — Encounter: Payer: Self-pay | Admitting: Internal Medicine

## 2022-04-17 VITALS — BP 136/82 | HR 79 | Ht 61.0 in | Wt 136.8 lb

## 2022-04-17 DIAGNOSIS — E785 Hyperlipidemia, unspecified: Secondary | ICD-10-CM | POA: Insufficient documentation

## 2022-04-17 DIAGNOSIS — E559 Vitamin D deficiency, unspecified: Secondary | ICD-10-CM | POA: Diagnosis not present

## 2022-04-17 DIAGNOSIS — Z79899 Other long term (current) drug therapy: Secondary | ICD-10-CM | POA: Diagnosis not present

## 2022-04-17 NOTE — Patient Instructions (Signed)
Medication Instructions:   *If you need a refill on your cardiac medications before your next appointment, please call your pharmacy*   Lab Work: Vit d level  If you have labs (blood work) drawn today and your tests are completely normal, you will receive your results only by: [place] (if you have MyChart) OR A paper copy in the mail If you have any lab test that is abnormal or we need to change your treatment, we will call you to review the results.   Testing/Procedures:    Follow-Up: At [facility], you and your health needs are our priority.  As part of our continuing mission to provide you with exceptional heart care, we have created designated Provider Care Teams.  These Care Teams include your primary Cardiologist (physician) and Advanced Practice Providers (APPs -  Physician Assistants and Nurse Practitioners) who all work together to provide you with the care you need, when you need it.  We recommend signing up for the patient portal called "MyChart".  Sign up information is provided on this After Visit Summary.  MyChart is used to connect with patients for Virtual Visits (Telemedicine).  Patients are able to view lab/test results, encounter notes, upcoming appointments, etc.  Non-urgent messages can be sent to your provider as well.   To learn more about what you can do with MyChart, go to [email or web address].    Your next appointment:    1 year(s)  The format for your next appointment:   In Person  Provider:   Dorris Carnes, MD     Other Instructions   Important Information About Sugar

## 2022-04-18 ENCOUNTER — Telehealth: Payer: Self-pay

## 2022-04-18 LAB — VITAMIN D 25 HYDROXY (VIT D DEFICIENCY, FRACTURES): Vit D, 25-Hydroxy: 30.5 ng/mL (ref 30.0–100.0)

## 2022-04-18 MED ORDER — VITAMIN D 125 MCG (5000 UT) PO CAPS: 5000.0000 [IU] | ORAL_CAPSULE | Freq: Every day | ORAL | 3 refills | Status: AC

## 2022-04-18 NOTE — Telephone Encounter (Signed)
The patient has been notified of the result and verbalized understanding.  All questions (if any) were answered. Stephani Police, RN 04/18/2022 1:58 PM

## 2022-04-18 NOTE — Telephone Encounter (Signed)
-----   Message from Fay Records, MD sent at 04/18/2022 12:54 PM EST ----- Vit D is low   30.   Take 5000 units daily    Check Vit D in 4 months

## 2022-04-22 DIAGNOSIS — D2261 Melanocytic nevi of right upper limb, including shoulder: Secondary | ICD-10-CM | POA: Diagnosis not present

## 2022-04-22 DIAGNOSIS — D1801 Hemangioma of skin and subcutaneous tissue: Secondary | ICD-10-CM | POA: Diagnosis not present

## 2022-04-22 DIAGNOSIS — D2271 Melanocytic nevi of right lower limb, including hip: Secondary | ICD-10-CM | POA: Diagnosis not present

## 2022-04-22 DIAGNOSIS — D225 Melanocytic nevi of trunk: Secondary | ICD-10-CM | POA: Diagnosis not present

## 2022-04-22 DIAGNOSIS — L918 Other hypertrophic disorders of the skin: Secondary | ICD-10-CM | POA: Diagnosis not present

## 2022-04-22 DIAGNOSIS — L570 Actinic keratosis: Secondary | ICD-10-CM | POA: Diagnosis not present

## 2022-04-22 DIAGNOSIS — L821 Other seborrheic keratosis: Secondary | ICD-10-CM | POA: Diagnosis not present

## 2022-05-21 NOTE — Progress Notes (Signed)
72 y.o. G75P2002 Married Caucasian female here for an office visit.   She is followed for vaginal atrophy and osteoporosis.  BMD scheduled for April. She is using vaginal estrogen cream twice weekly and does not need a refill of estrogen.   Good bladder control.   Her umbilicus is red and wants this checked.  She has been having drainage of this.  She saw GI and was treated for skin yeast infection.  Nystatin cream has treated this but when she stops, the redness resumes.  She had a CT scan.  The umbilical skin was unremarkable.  She has hepatic cysts which are decreasing in size.   She stopped Prevastatin and started Vit D and is feeling better.   Husband has Parkinson's disease.  PCP:   Sylvie Farrier, DO  Patient's last menstrual period was 04/14/2002.           Sexually active: No.  The current method of family planning is post menopausal status.    Exercising: Yes.     180 hours of strength training, cardio, flexbility, and balance Smoker:  no  Health Maintenance: Pap:  05/30/21 neg: HR HPV neg, 05/23/19 neg History of abnormal Pap:  no MMG:  06/02/22 - Exam result pending at [facility],  05/31/21 Breast Density Category B, BI-RADS CATEGORY 1 Neg Colonoscopy:  12/26/20 BMD:   08/06/20  Result  osteoporotic.  Untreated. TDaP:  05/22/11 - She will do with PCP.  Gardasil:   no HIV: neg years ago Hep C: 2017 neg Screening Labs:  PCP   reports that she has never smoked. She has never used smokeless tobacco. She reports current alcohol use of about 7.0 - 14.0 standard drinks of alcohol per week. She reports that she does not use drugs.  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     calcium carbonate (OSCAL) 1500 (600 Ca) MG TABS tablet Take by mouth 2 (two) times daily with a meal.     Cholecalciferol (VITAMIN D) 125 MCG (5000 UT) CAPS Take 5,000 Units by mouth daily in the afternoon. 100 capsule 3   clobetasol ointment (TEMOVATE) [id number] % Apply 1 Application topically as needed.     Coenzyme Q10 (COQ-10) 100 MG CAPS      estradiol (ESTRACE) 0.1 MG/GM vaginal cream Use 1/2 g vaginally every night for the first 2 weeks, then use 1/2 g vaginally two times a week. 42.5 g 0   Multiple Vitamin (MULTIVITAMIN) tablet Take 1 tablet by mouth daily.     Multiple Vitamins-Minerals (PRESERVISION AREDS 2) CAPS Take 1 tablet by mouth 2 (two) times daily.  nystatin-triamcinolone (MYCOLOG II) cream Apply 1 application topically 2 (two) times daily. Apply to affected area BID for up to 7 days. 60 g 0   Omega-3 Fatty Acids (OMEGA 3 PO) Take 3,000 mg by mouth daily.     fluticasone (FLONASE) 50 MCG/ACT nasal spray Place 1 spray into both nostrils as needed for allergies or rhinitis. (Patient not taking: Reported on 06/03/2022)     No current facility-administered medications for this visit.    Family History  Problem Relation Age of Onset   Hypertension Mother    CVA Mother    Stroke Mother    COPD Mother    Rheum arthritis Father    Congestive Heart Failure Father    Healthy Brother    CVA Maternal Grandmother    CVA Maternal Grandfather    Rheum arthritis Paternal Grandmother    Cirrhosis Paternal Grandfather    Colon cancer Neg Hx    Colon polyps Neg Hx    Esophageal cancer Neg Hx     Rectal cancer Neg Hx    Stomach cancer Neg Hx     Review of Systems  All other systems reviewed and are negative.   Exam:   BP 136/82 (BP Location: Left Arm, Patient Position: Sitting, Cuff Size: Normal)   Pulse 89   Ht 5' 1"$  (1.549 m)   Wt 134 lb (60.8 kg)   LMP 04/14/2002   SpO2 99%   BMI 25.32 kg/m     General appearance: alert, cooperative and appears stated age Head: normocephalic, without obvious abnormality, atraumatic Neck: no adenopathy, supple, symmetrical, trachea midline and thyroid normal to inspection and palpation Lungs: clear to auscultation bilaterally Breasts: normal appearance, no masses or tenderness, No nipple retraction or dimpling, No nipple discharge or bleeding, No axillary adenopathy Heart: regular rate and rhythm Abdomen: soft, non-tender; no masses, no organomegaly.  Mild erythema of the umbilicus.  No cellulitis. Extremities: extremities normal, atraumatic, no cyanosis or edema Skin: skin color, texture, turgor normal. No rashes or lesions Lymph nodes: cervical, supraclavicular, and axillary nodes normal. Neurologic: grossly normal  Pelvic: External genitalia:  no lesions              No abnormal inguinal nodes palpated.              Urethra:  normal appearing urethra with no masses, tenderness or lesions              Bartholins and Skenes: normal                 Vagina: normal appearing vagina with normal color and discharge, no lesions              Cervix: no lesions              Pap taken: no Bimanual Exam:  Uterus:  normal size, contour, position, consistency, mobility, non-tender              Adnexa: no mass, fullness, tenderness              Rectal exam: yes.  Confirms.              Anus:  normal sphincter tone, no lesions  Chaperone was present for exam:  Emily  Assessment:     Urogenital atrophy.  Hx midurethral sling.  Osteoporosis.   Untreated.  Umbilical erythema consistent with candida of flexural fold.  Plan:  Mammogram  screening discussed. Self breast awareness reviewed. She has sufficient vaginal estradiol  cream.   She will call for refills as needed. Guidelines for Calcium, Vitamin D, regular exercise program including cardiovascular and weight bearing exercise. BMD. She can continue to use Nystatin as needed. Follow up annually and prn.   After visit summary provided.   30 min  total time was spent for this patient encounter, including preparation, face-to-face counseling with the patient, coordination of care, and documentation of the encounter.

## 2022-06-02 ENCOUNTER — Ambulatory Visit
Admission: RE | Admit: 2022-06-02 | Discharge: 2022-06-02 | Disposition: A | Discharge status: Home / Self Care | Payer: Medicare Other | Source: Ambulatory Visit | Attending: Obstetrics and Gynecology | Admitting: Obstetrics and Gynecology

## 2022-06-02 DIAGNOSIS — Z1231 Encounter for screening mammogram for malignant neoplasm of breast: Secondary | ICD-10-CM

## 2022-06-03 ENCOUNTER — Encounter: Payer: Self-pay | Admitting: Obstetrics and Gynecology

## 2022-06-03 ENCOUNTER — Ambulatory Visit (INDEPENDENT_AMBULATORY_CARE_PROVIDER_SITE_OTHER): Payer: Medicare Other | Admitting: Obstetrics and Gynecology

## 2022-06-03 VITALS — BP 136/82 | HR 89 | Ht 61.0 in | Wt 134.0 lb

## 2022-06-03 DIAGNOSIS — N952 Postmenopausal atrophic vaginitis: Secondary | ICD-10-CM | POA: Diagnosis not present

## 2022-06-03 DIAGNOSIS — M810 Age-related osteoporosis without current pathological fracture: Secondary | ICD-10-CM

## 2022-06-03 DIAGNOSIS — Z5181 Encounter for therapeutic drug level monitoring: Secondary | ICD-10-CM

## 2022-06-30 ENCOUNTER — Encounter: Payer: Self-pay | Admitting: Gastroenterology

## 2022-08-08 ENCOUNTER — Ambulatory Visit
Admission: RE | Admit: 2022-08-08 | Discharge: 2022-08-08 | Disposition: A | Discharge status: Home / Self Care | Payer: Medicare Other | Source: Ambulatory Visit | Attending: Obstetrics and Gynecology | Admitting: Obstetrics and Gynecology

## 2022-08-08 DIAGNOSIS — M8589 Other specified disorders of bone density and structure, multiple sites: Secondary | ICD-10-CM | POA: Diagnosis not present

## 2022-08-08 DIAGNOSIS — M810 Age-related osteoporosis without current pathological fracture: Secondary | ICD-10-CM

## 2022-08-08 DIAGNOSIS — Z780 Asymptomatic menopausal state: Secondary | ICD-10-CM | POA: Diagnosis not present

## 2022-08-12 ENCOUNTER — Encounter: Payer: Self-pay | Admitting: Obstetrics and Gynecology

## 2022-08-12 DIAGNOSIS — M8589 Other specified disorders of bone density and structure, multiple sites: Secondary | ICD-10-CM | POA: Diagnosis not present

## 2022-08-12 DIAGNOSIS — E785 Hyperlipidemia, unspecified: Secondary | ICD-10-CM | POA: Diagnosis not present

## 2022-08-12 DIAGNOSIS — Z1231 Encounter for screening mammogram for malignant neoplasm of breast: Secondary | ICD-10-CM | POA: Diagnosis not present

## 2022-08-12 DIAGNOSIS — E559 Vitamin D deficiency, unspecified: Secondary | ICD-10-CM | POA: Diagnosis not present

## 2022-08-12 DIAGNOSIS — Z7185 Encounter for immunization safety counseling: Secondary | ICD-10-CM | POA: Diagnosis not present

## 2022-08-12 DIAGNOSIS — Z0000 Encounter for general adult medical examination without abnormal findings: Secondary | ICD-10-CM | POA: Diagnosis not present

## 2022-08-12 DIAGNOSIS — Z1211 Encounter for screening for malignant neoplasm of colon: Secondary | ICD-10-CM | POA: Diagnosis not present

## 2022-09-25 ENCOUNTER — Telehealth: Payer: Self-pay | Admitting: Internal Medicine

## 2022-09-25 DIAGNOSIS — E559 Vitamin D deficiency, unspecified: Secondary | ICD-10-CM

## 2022-09-25 DIAGNOSIS — Z0000 Encounter for general adult medical examination without abnormal findings: Secondary | ICD-10-CM

## 2022-09-25 DIAGNOSIS — E785 Hyperlipidemia, unspecified: Secondary | ICD-10-CM

## 2022-09-25 DIAGNOSIS — Z79899 Other long term (current) drug therapy: Secondary | ICD-10-CM

## 2022-09-25 NOTE — Telephone Encounter (Signed)
Patient would like to know if she needs to have her lipid panel done prior to her appointment in October. She says she would prefer a response in mychart.

## 2022-09-26 NOTE — Telephone Encounter (Signed)
Set up for CBC, BMET, lipomed, ApoB and Vit D

## 2022-09-29 NOTE — Telephone Encounter (Signed)
My Chart sent to the pt and labs ordered just need lab date.

## 2022-10-28 ENCOUNTER — Other Ambulatory Visit: Payer: Self-pay | Admitting: Obstetrics and Gynecology

## 2022-10-28 DIAGNOSIS — Z1231 Encounter for screening mammogram for malignant neoplasm of breast: Secondary | ICD-10-CM

## 2022-12-04 ENCOUNTER — Encounter: Payer: Self-pay | Admitting: Internal Medicine

## 2022-12-04 ENCOUNTER — Other Ambulatory Visit: Payer: Self-pay

## 2022-12-04 DIAGNOSIS — E559 Vitamin D deficiency, unspecified: Secondary | ICD-10-CM

## 2022-12-04 DIAGNOSIS — Z79899 Other long term (current) drug therapy: Secondary | ICD-10-CM

## 2022-12-04 DIAGNOSIS — E785 Hyperlipidemia, unspecified: Secondary | ICD-10-CM

## 2022-12-04 DIAGNOSIS — Z0000 Encounter for general adult medical examination without abnormal findings: Secondary | ICD-10-CM

## 2022-12-08 DIAGNOSIS — D3121 Benign neoplasm of right retina: Secondary | ICD-10-CM | POA: Diagnosis not present

## 2022-12-08 DIAGNOSIS — D3122 Benign neoplasm of left retina: Secondary | ICD-10-CM | POA: Diagnosis not present

## 2022-12-08 DIAGNOSIS — H2513 Age-related nuclear cataract, bilateral: Secondary | ICD-10-CM | POA: Diagnosis not present

## 2022-12-24 DIAGNOSIS — Z23 Encounter for immunization: Secondary | ICD-10-CM | POA: Diagnosis not present

## 2022-12-31 DIAGNOSIS — Z23 Encounter for immunization: Secondary | ICD-10-CM | POA: Diagnosis not present

## 2023-01-08 DIAGNOSIS — L282 Other prurigo: Secondary | ICD-10-CM | POA: Diagnosis not present

## 2023-01-20 ENCOUNTER — Other Ambulatory Visit: Payer: Medicare Other

## 2023-01-26 ENCOUNTER — Ambulatory Visit: Payer: Medicare Other | Admitting: Internal Medicine

## 2023-02-11 IMAGING — MG MM DIGITAL SCREENING BILAT W/ TOMO AND CAD
6 of 10 series · 6 of 30 positions shown · non-contrast
Comparison: Previous exam(s).

CLINICAL DATA: Screening.

EXAM:
DIGITAL SCREENING BILATERAL MAMMOGRAM WITH TOMOSYNTHESIS AND CAD
TECHNIQUE: Bilateral screening digital craniocaudal and mediolateral oblique
mammograms were obtained. Bilateral screening digital breast
tomosynthesis was performed. The images were evaluated with
computer-aided detection.

[L CC synth-2D]
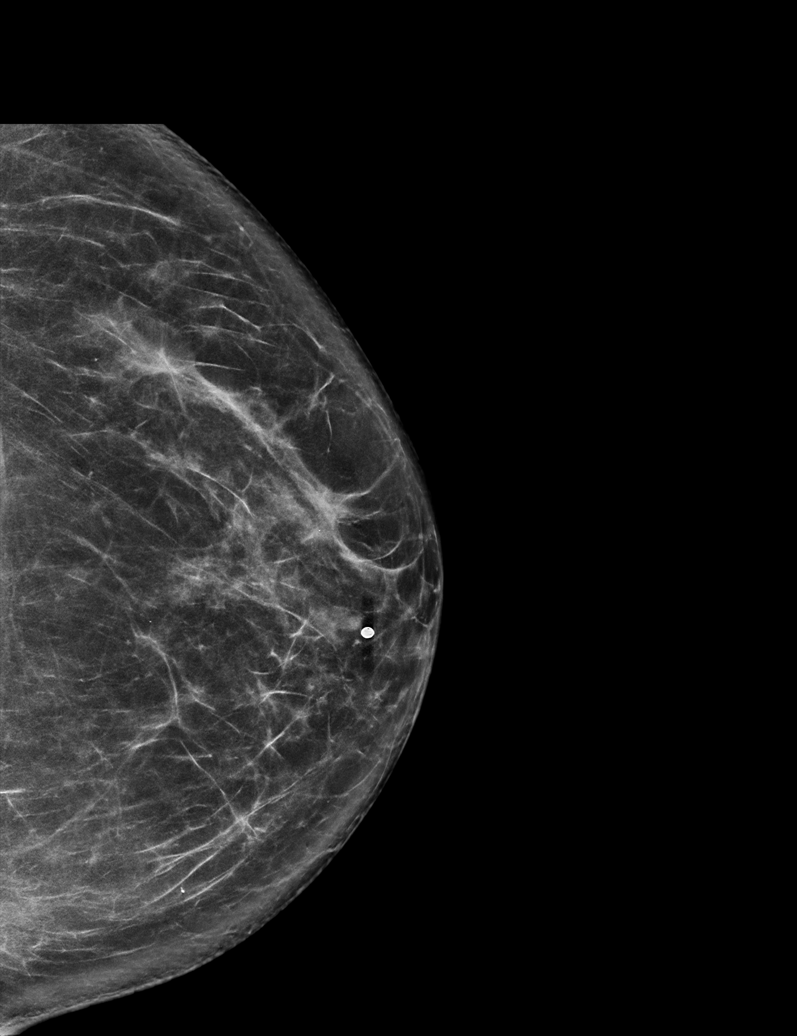

[R MLO synth-2D]
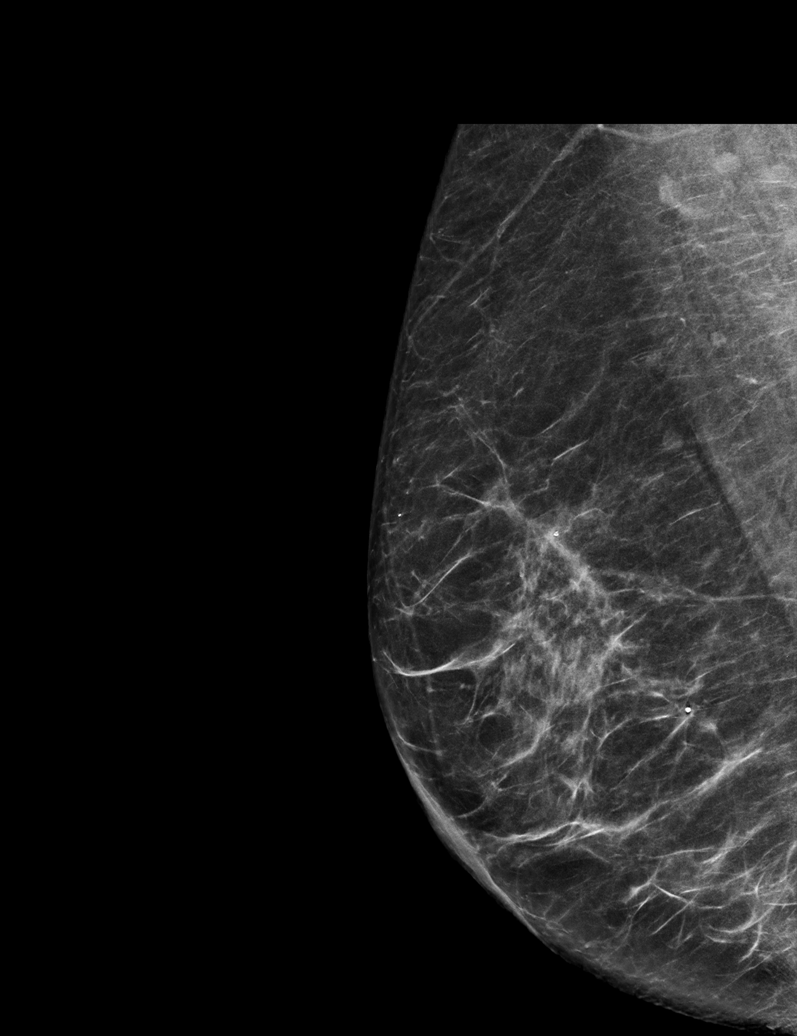

[R CC synth-2D]
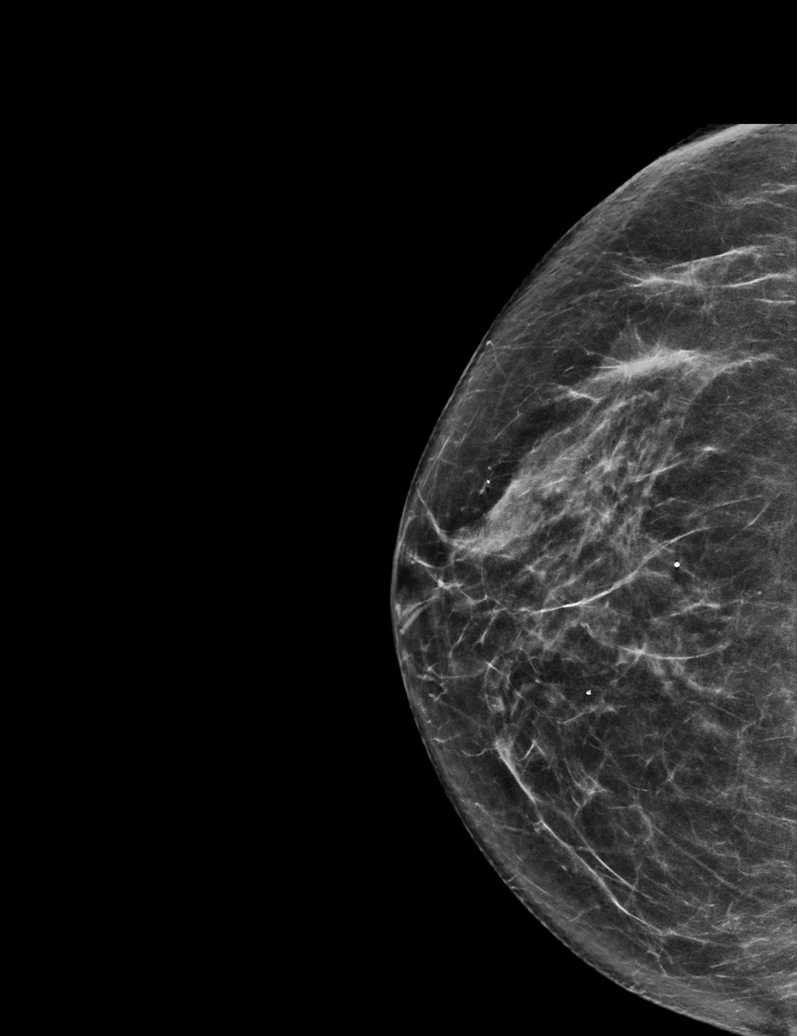

[R CV synth-2D]
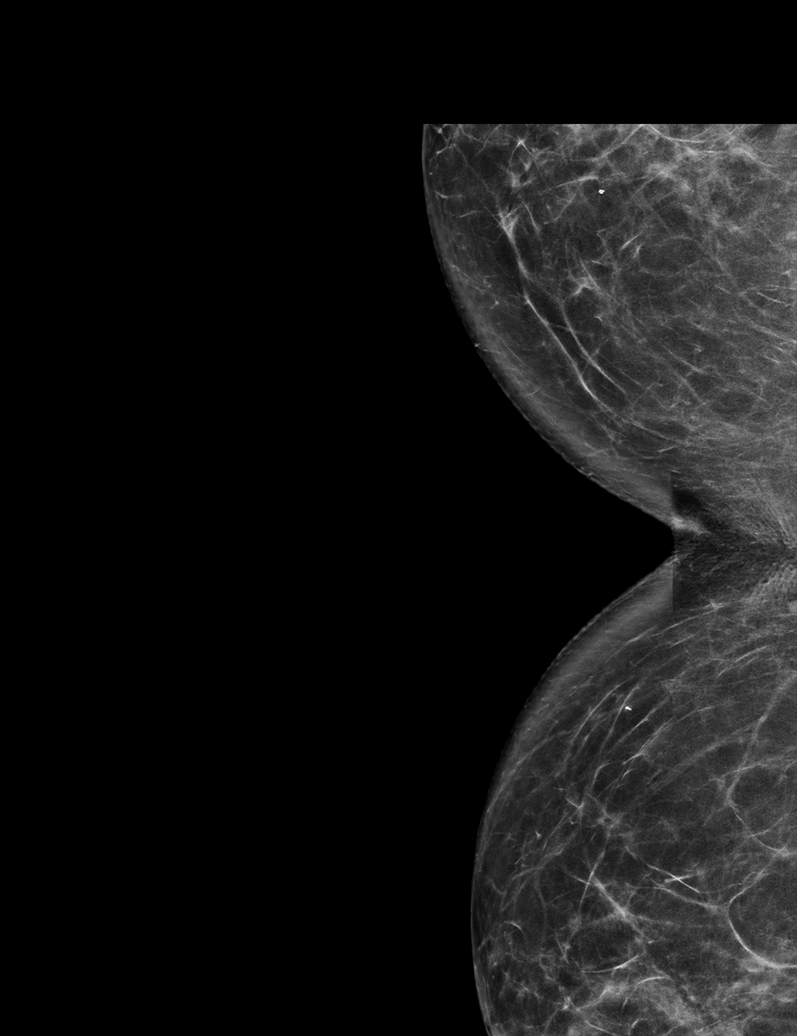

[L MLO synth-2D]
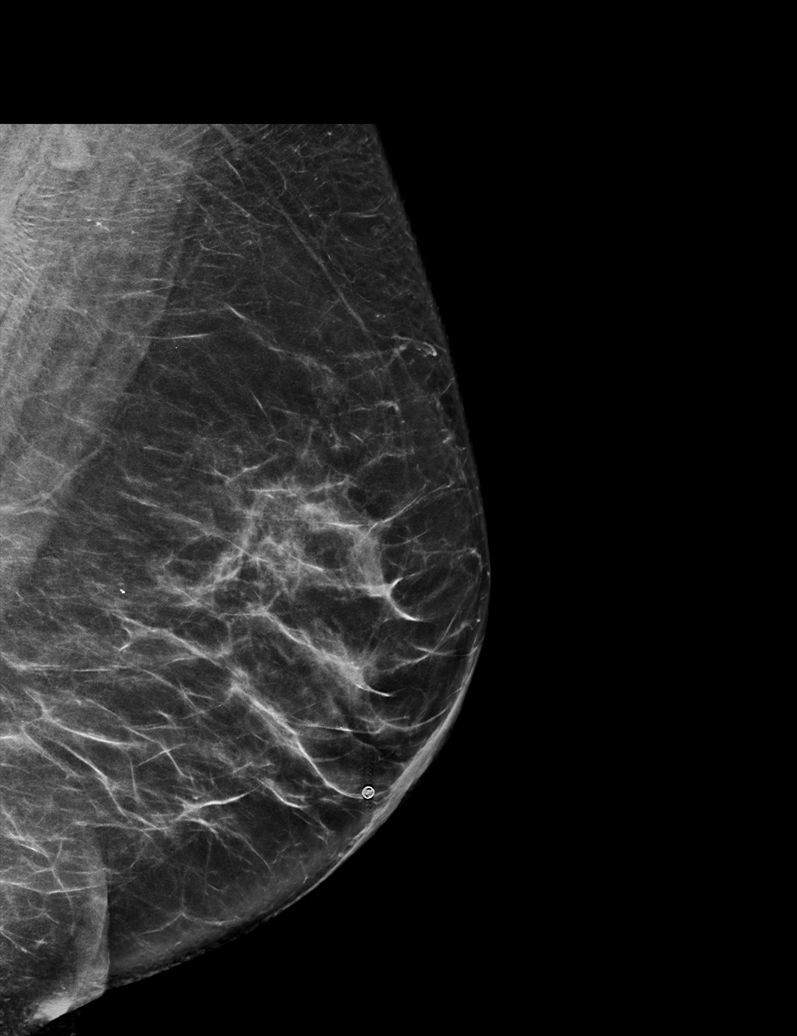

[L MLO tomo · tomo slice 37/72.0]
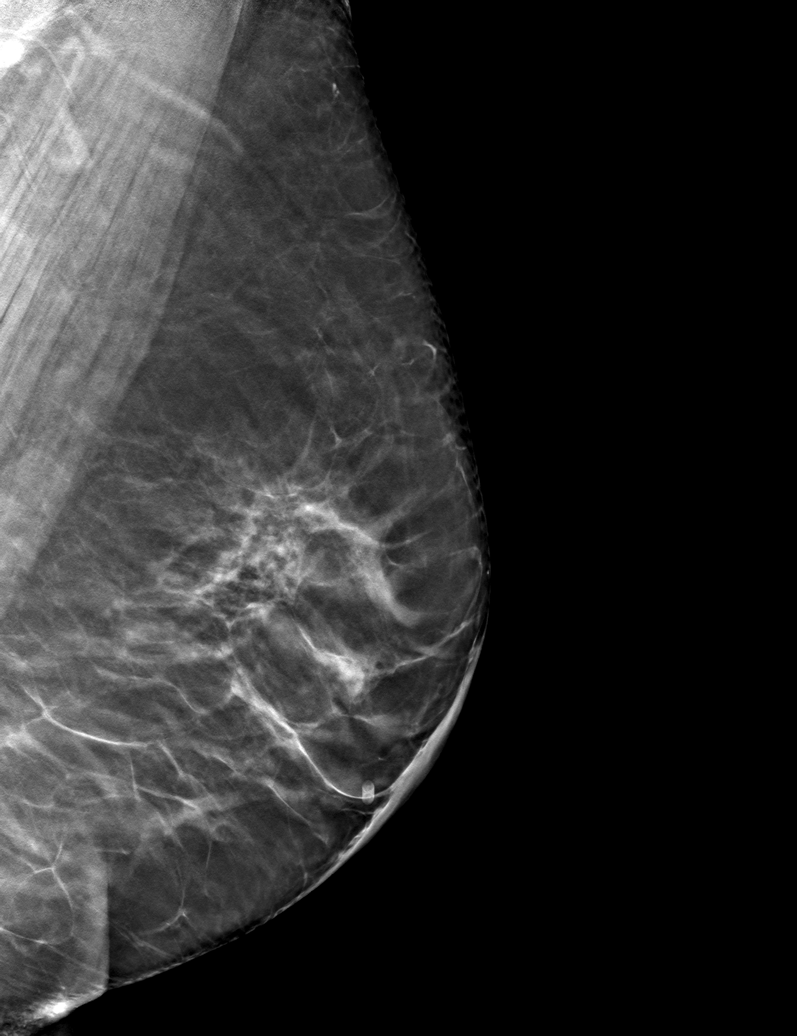

[6 of 30 positions shown; findings below may reference images not displayed]

ACR Breast Density Category b: There are scattered areas of
fibroglandular density.
FINDINGS: There are no findings suspicious for malignancy.
IMPRESSION: No mammographic evidence of malignancy. A result letter of this
screening mammogram will be mailed directly to the patient.

RECOMMENDATION:
Screening mammogram in one year. (Code:51-O-LD2)

BI-RADS CATEGORY  1: Negative.

## 2023-03-03 DIAGNOSIS — H43391 Other vitreous opacities, right eye: Secondary | ICD-10-CM | POA: Diagnosis not present

## 2023-03-03 DIAGNOSIS — H2513 Age-related nuclear cataract, bilateral: Secondary | ICD-10-CM | POA: Diagnosis not present

## 2023-03-03 DIAGNOSIS — D3132 Benign neoplasm of left choroid: Secondary | ICD-10-CM | POA: Diagnosis not present

## 2023-04-21 DIAGNOSIS — M25511 Pain in right shoulder: Secondary | ICD-10-CM | POA: Diagnosis not present

## 2023-04-23 DIAGNOSIS — Z0000 Encounter for general adult medical examination without abnormal findings: Secondary | ICD-10-CM | POA: Diagnosis not present

## 2023-04-23 DIAGNOSIS — E785 Hyperlipidemia, unspecified: Secondary | ICD-10-CM | POA: Diagnosis not present

## 2023-04-23 DIAGNOSIS — Z79899 Other long term (current) drug therapy: Secondary | ICD-10-CM | POA: Diagnosis not present

## 2023-04-23 DIAGNOSIS — E559 Vitamin D deficiency, unspecified: Secondary | ICD-10-CM | POA: Diagnosis not present

## 2023-04-23 LAB — BASIC METABOLIC PANEL
BUN/Creatinine Ratio: 27 (ref 12–28)
BUN: 18 mg/dL (ref 8–27)
CO2: 24 mmol/L (ref 20–29)
Calcium: 9.5 mg/dL (ref 8.7–10.3)
Chloride: 103 mmol/L (ref 96–106)
Creatinine, Ser: 0.66 mg/dL (ref 0.57–1.00)
Glucose: 90 mg/dL (ref 70–99)
Potassium: 4.4 mmol/L (ref 3.5–5.2)
Sodium: 141 mmol/L (ref 134–144)
eGFR: 94 mL/min/{1.73_m2} (ref >59)

## 2023-04-23 LAB — VITAMIN D 25 HYDROXY (VIT D DEFICIENCY, FRACTURES): Vit D, 25-Hydroxy: 61.9 ng/mL (ref 30.0–100.0)

## 2023-04-24 ENCOUNTER — Encounter: Payer: Self-pay | Admitting: Internal Medicine

## 2023-04-24 LAB — NMR, LIPOPROFILE
Cholesterol, Total: 334 mg/dL — ABNORMAL HIGH (ref 100–199)
HDL Particle Number: 44.3 umol/L (ref >=30.5)
HDL-C: 107 mg/dL (ref >39)
LDL Particle Number: 2008 nmol/L — ABNORMAL HIGH (ref <1000)
LDL Size: 22.2 nmol (ref >20.5)
LDL-C (NIH Calc): 214 mg/dL — ABNORMAL HIGH (ref 0–99)
LP-IR Score: <25 (ref <=45)
Small LDL Particle Number: <90 nmol/L (ref <=527)
Triglycerides: 84 mg/dL (ref 0–149)

## 2023-04-24 LAB — CBC
Hematocrit: 40.3 % (ref 34.0–46.6)
Hemoglobin: 13.6 g/dL (ref 11.1–15.9)
MCH: 31 pg (ref 26.6–33.0)
MCHC: 33.7 g/dL (ref 31.5–35.7)
MCV: 92 fL (ref 79–97)
Platelets: 326 10*3/uL (ref 150–450)
RBC: 4.39 x10E6/uL (ref 3.77–5.28)
RDW: 13.5 % (ref 11.7–15.4)
WBC: 4.1 10*3/uL (ref 3.4–10.8)

## 2023-04-24 LAB — APOLIPOPROTEIN B: Apolipoprotein B: 152 mg/dL — ABNORMAL HIGH (ref <90)

## 2023-04-27 NOTE — Telephone Encounter (Signed)
 Spoke to patinet    Reviewed monitors , CT and MRI He has started [employer]  Will bring BP cuff and log with him to appt on 05/20/23   Will probably modify meds then Has insurance form in message that needs printing so I can forward to company

## 2023-05-04 NOTE — Progress Notes (Unsigned)
Cardiology Office Note   Date:  05/05/2023   ID:  Amber, Escobar [birth year], MRN [id number]  PCP:  Koren Shiver, DO  Cardiologist:   Dietrich Pates, MD    Pt presents for f/u of palpitaiton and HL      History of Present Illness: Amber Escobar is a 73 y.o. female with a history of palpitations and profound dyslipidemia. I saw the pt in Dec 2022 Calcium score CT in 2020, score was 0    One pr The pt stopped pravastatin due to achiness    I last saw her in Jan 2024 Since seen she remains very active   Exercises 3x per week   Cares for husband who has Parkinsons    She denies CP  Breathing is good  No dizziness  No palpitations.  No achiness (much better off of pravastatin) BP readings from home and PCP   office was 110s  to 120s/   She was stressed when came in   [employer]  [facility] labs Apo B was 152    LDL 214  Lpa in 2022 was 57      FHX is cousin with HL and GM  Current Outpatient Medications  Medication Sig Dispense Refill   aspirin 81 MG tablet Take 81 mg by mouth daily.     calcium carbonate (OSCAL) 1500 (600 Ca) MG TABS tablet Take by mouth daily.     Cholecalciferol (VITAMIN D) 125 MCG (5000 UT) CAPS Take 5,000 Units by mouth daily in the afternoon. 100 capsule 3   clobetasol ointment (TEMOVATE) 0.05 % Apply 1 Application topically as needed.     Coenzyme Q10 (COQ-10) 100 MG CAPS      estradiol (ESTRACE) 0.1 MG/GM vaginal cream Use 1/2 g vaginally every night for the first 2 weeks, then use 1/2 g vaginally two times a week. 42.5 g 0   Multiple Vitamin (MULTIVITAMIN) tablet Take 1 tablet by mouth daily.     Multiple Vitamins-Minerals (PRESERVISION AREDS 2) CAPS Take 1 tablet by mouth 2 (two) times daily.     nystatin-triamcinolone (MYCOLOG II) cream Apply 1 application topically 2 (two) times daily. Apply to affected area BID for up to 7 days. 60 g 0   Omega-3 Fatty Acids (OMEGA 3 PO) Take 3,000 mg by mouth daily.     No current  facility-administered medications for this visit.    Allergies:   Patient has no known allergies.   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 She has never used smokeless tobacco. She reports current alcohol use of about 7.0 - 14.0 standard drinks of alcohol per week. She  reports that she does not use drugs.   Family History:  The patient's family history includes COPD in her mother; CVA in her maternal grandfather, maternal grandmother, and mother; Cirrhosis in her paternal grandfather; Congestive Heart Failure in her father; Healthy in her brother; Hypertension in her mother; Rheum arthritis in her father and paternal grandmother; Stroke in her mother.    ROS:  Please see the history of present illness. All other systems are reviewed and  Negative to the above problem except as noted.    PHYSICAL EXAM: VS:  BP (!) 158/60   Pulse 77   Ht 5\' 1"  (1.549 m)   Wt 132 lb 9.6 oz (60.1 kg)   LMP 04/14/2002   SpO2 96%   BMI 25.05 kg/m   GEN: Well nourished and  in no acute distress HEENT: normal Neck: no JVD, no carotid bruits Cardiac: RRR; Gr I/VI systolic murmur LSB.  No LE edema  Respiratory:  clear to auscultation   GI: soft, nontender, no hepatomegaly  MS: no deformity Moving all extremities   Skin: warm and dry, no rash Neuro:  Strength and  sensation are intact Psych: euthymic mood, full affect   EKG:  EKG is ordered today.   NSR  77 bpm    Lipid Panel    Component Value Date/Time   CHOL 259 (H) 06/19/2020 1006   TRIG 90 06/19/2020 1006   HDL 86 06/19/2020 1006   CHOLHDL 3.0 06/19/2020 1006   LDLCALC 158 (H) 06/19/2020 1006      Wt Readings from Last 3 Encounters:  05/05/23 132 lb 9.6 oz (60.1 kg)  06/03/22 134 lb (60.8 kg)  04/17/22 136 lb 12.8 oz (62.1 kg)    CT Calcium Score    04/05/19 Calcium score 0   Mild atheroscleroiss of aorta   ASSESSMENT AND PLAN:  1  HL   Recent labs LDL 214   She would consider intermitt pravachol or another statin   She has also asked about Zetia. I would recomm a trial of Zetia alone to start. Follow response and tolerability   She will need a combo at least     Based on response then consider another statin like lipitor or Crestor intermitt   Follow   2 Hx palpitatons   Pt denies     3  Blood pressure   BP is much better at PCP and at home     Follow up labs in April     Disposition:   FU in  clinic 1 year    Signed, Dietrich Pates, MD  05/05/2023 8:11 AM    [facility] [address] [place], [place], [place]  [address] Phone: [phone]; Fax: [phone]

## 2023-05-05 ENCOUNTER — Ambulatory Visit: Payer: Medicare Other | Attending: Internal Medicine | Admitting: Internal Medicine

## 2023-05-05 ENCOUNTER — Encounter: Payer: Self-pay | Admitting: Internal Medicine

## 2023-05-05 VITALS — BP 158/60 | HR 77 | Ht 61.0 in | Wt 132.6 lb

## 2023-05-05 DIAGNOSIS — Z09 Encounter for follow-up examination after completed treatment for conditions other than malignant neoplasm: Secondary | ICD-10-CM | POA: Insufficient documentation

## 2023-05-05 DIAGNOSIS — I10 Essential (primary) hypertension: Secondary | ICD-10-CM | POA: Diagnosis not present

## 2023-05-05 DIAGNOSIS — E785 Hyperlipidemia, unspecified: Secondary | ICD-10-CM | POA: Insufficient documentation

## 2023-05-05 DIAGNOSIS — R002 Palpitations: Secondary | ICD-10-CM | POA: Diagnosis not present

## 2023-05-05 MED ORDER — EZETIMIBE 10 MG PO TABS: 10.0000 mg | ORAL_TABLET | Freq: Every day | ORAL | 3 refills | Status: DC

## 2023-05-05 NOTE — Patient Instructions (Addendum)
Medication Instructions:  Your physician has recommended you make the following change in your medication:   START Zetia 10 mg once daily    *If you need a refill on your cardiac medications before your next appointment, please call your pharmacy*   Lab Work:  Lipoprofile in April   If you have labs (blood work) drawn today and your tests are completely normal, you will receive your results only by: MyChart Message (if you have MyChart) OR A paper copy in the mail If you have any lab test that is abnormal or we need to change your treatment, we will call you to review the results.   Testing/Procedures: none   Follow-Up: At [facility], you and your health needs are our priority.  As part of our continuing mission to provide you with exceptional heart care, we have created designated Provider Care Teams.  These Care Teams include your primary Cardiologist (physician) and Advanced Practice Providers (APPs -  Physician Assistants and Nurse Practitioners) who all work together to provide you with the care you need, when you need it.  We recommend signing up for the patient portal called "MyChart".  Sign up information is provided on this After Visit Summary.  MyChart is used to connect with patients for Virtual Visits (Telemedicine).  Patients are able to view lab/test results, encounter notes, upcoming appointments, etc.  Non-urgent messages can be sent to your provider as well.   To learn more about what you can do with MyChart, go to [email or web address].    Your next appointment:   1 year(s)  Provider:   Dietrich Pates, MD

## 2023-05-18 DIAGNOSIS — L245 Irritant contact dermatitis due to other chemical products: Secondary | ICD-10-CM | POA: Diagnosis not present

## 2023-05-18 DIAGNOSIS — L720 Epidermal cyst: Secondary | ICD-10-CM | POA: Diagnosis not present

## 2023-05-18 DIAGNOSIS — L738 Other specified follicular disorders: Secondary | ICD-10-CM | POA: Diagnosis not present

## 2023-05-25 NOTE — Progress Notes (Signed)
 73 y.o. G53P2002 Married Caucasian female here for a breast and pelvic exam.    The patient is also followed for vaginal atrophy and osteoporosis.  Not having any vaginal bleeding.   Good bladder function and control.  Now taking Zetia for elevated cholesterol.  Husband has Parkinson's.  PCP: Koren Shiver, DO   Patient's last menstrual period was 04/14/2002.           Sexually active: No.  The current method of family planning is post menopausal status.    Menopausal hormone therapy:  estrace Exercising: Yes.     180 in cardio, balance and strength, gardening, walking Smoker:  no  OB History     Gravida  2   Para  2   Term  2   Preterm      AB      Living  2      SAB      IAB      Ectopic      Multiple      Live Births              HEALTH MAINTENANCE: Last 2 paps: 05/30/21 neg: HR HPV neg, 05/23/19 neg History of abnormal Pap or positive HPV:  no Mammogram: scheduled wed per pt,  06/02/22 Breast Density cat B, BI-RADS CAT 1 neg Colonoscopy:  12/26/20 - due in 2029 Bone Density:  08/08/22  Result  osteopenia   Immunization History  Administered Date(s) Administered   Hepatitis B, ADULT 06/18/1998, 07/19/1998, 12/19/1998   Influenza Split 12/26/2011, 01/11/2013, 01/25/2021   Influenza,inj,Quad PF,6+ Mos 01/22/2016   Influenza,inj,quad, With Preservative 12/29/2013, 01/23/2015   Influenza-Unspecified 01/13/2016, 12/29/2016, 01/01/2018, 12/17/2018, 01/30/2020   PFIZER(Purple Top)SARS-COV-2 Vaccination 05/09/2019, 05/30/2019, 01/07/2020, 01/07/2020, 08/07/2020   Pneumococcal Conjugate-13 08/06/2016   Pneumococcal Polysaccharide-23 01/23/2015, 07/06/2019   Pneumococcal-Unspecified 01/23/2015   Tdap 05/22/2011   Zoster Recombinant(Shingrix) 01/06/2019, 03/17/2019   Zoster, Live 07/10/2011, 01/06/2019, 03/17/2019      reports that she has never smoked. She has never used smokeless tobacco. She reports current alcohol use of about 7.0 - 14.0  standard drinks of alcohol per week. She reports that she does not use drugs.  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     calcium carbonate (OSCAL) 1500 (600 Ca) MG TABS tablet Take by mouth daily.     Cholecalciferol (VITAMIN D) 125 MCG (5000 UT) CAPS Take 5,000 Units by mouth daily in the afternoon. 100 capsule 3   clobetasol ointment (TEMOVATE) 0.05 % Apply 1 Application topically as needed.     Coenzyme Q10 (COQ-10) 100 MG CAPS  estradiol (ESTRACE) 0.1 MG/GM vaginal cream Use 1/2 g vaginally every night for the first 2 weeks, then use 1/2 g vaginally two times a week. 42.5 g 0   ezetimibe (ZETIA) 10 MG tablet Take 1 tablet (10 mg total) by mouth daily. 90 tablet 3   Multiple Vitamin (MULTIVITAMIN) tablet Take 1 tablet by mouth daily.     Multiple Vitamins-Minerals (PRESERVISION AREDS 2) CAPS Take 1 tablet by mouth 2 (two) times daily.     nystatin-triamcinolone (MYCOLOG II) cream Apply 1 application topically 2 (two) times daily. Apply to affected area BID for up to 7 days. 60 g 0   Omega-3 Fatty Acids (OMEGA 3 PO) Take 3,000 mg by mouth daily.     No current facility-administered medications for this visit.    ALLERGIES: Patient has no known allergies.  Family History  Problem Relation Age of  Onset   Hypertension Mother    CVA Mother    Stroke Mother    COPD Mother    Rheum arthritis Father    Congestive Heart Failure Father    Healthy Brother    CVA Maternal Grandmother    CVA Maternal Grandfather    Rheum arthritis Paternal Grandmother    Cirrhosis Paternal Grandfather    Colon cancer Neg Hx    Colon polyps Neg Hx    Esophageal cancer Neg Hx    Rectal cancer Neg Hx    Stomach cancer Neg Hx     Review of Systems  All other systems reviewed and are negative.   PHYSICAL EXAM:  BP 136/86 (BP Location: Left Arm, Patient Position: Sitting, Cuff Size: Small)   Pulse 90   Ht 5\' 1"  (1.549 m)   Wt 132 lb (59.9 kg)   LMP 04/14/2002   SpO2 95%   BMI 24.94 kg/m     General appearance: alert, cooperative and appears stated age Head: normocephalic, without obvious abnormality, atraumatic Neck: no adenopathy, supple, symmetrical, trachea midline and thyroid normal to inspection and palpation Lungs: clear to auscultation bilaterally Breasts: normal appearance, no masses or tenderness, No nipple retraction or dimpling, No nipple discharge or bleeding, No axillary adenopathy Heart: regular rate and rhythm Abdomen: soft, non-tender; no masses, no organomegaly Extremities: extremities normal, atraumatic, no cyanosis or edema Skin: skin color, texture, turgor normal. No rashes or lesions Lymph nodes: cervical, supraclavicular, and axillary nodes normal. Neurologic: grossly normal  Pelvic: External genitalia:  no lesions              No abnormal inguinal nodes palpated.              Urethra:  normal appearing urethra with no masses, tenderness or lesions.  Sling is protected.               Bartholins and Skenes: normal                 Vagina: normal appearing vagina with normal color and discharge, no lesions              Cervix: no lesions              Pap taken: no Bimanual Exam:  Uterus:  normal size, contour, position, consistency, mobility, non-tender               Adnexa: no mass, fullness, tenderness              Rectal exam: yes.  Confirms.  Anus:  normal sphincter tone, no lesions  Chaperone was present for exam:  Warren Lacy, CMA  ASSESSMENT: Encounter for breast and pelvic exam.  Urogenital atrophy.  Encounter for medication monitoring.  Hx midurethral sling.  Hx osteoporosis.  Current osteopenia.  Not on medication.   PLAN: Mammogram screening discussed. Self breast awareness reviewed. Pap and HRV collected:  No.  Due in 2029.  Guidelines for Calcium, Vitamin D, regular exercise program including cardiovascular and weight bearing exercise. Medication refills:  Estradiol cream.  We discussed risks and benefits.   BMD due in April. 2026. Follow up:  yearly and prn.     Additional counseling given.  yes. 20 min  total time was spent for this patient encounter, including preparation, face-to-face counseling with the patient, coordination of care, and documentation of the encounter in addition to doing the breast and pelvic exam.

## 2023-06-04 ENCOUNTER — Ambulatory Visit: Payer: Medicare Other

## 2023-06-08 ENCOUNTER — Ambulatory Visit (INDEPENDENT_AMBULATORY_CARE_PROVIDER_SITE_OTHER): Payer: Medicare Other | Admitting: Obstetrics and Gynecology

## 2023-06-08 ENCOUNTER — Encounter: Payer: Medicare Other | Admitting: Obstetrics and Gynecology

## 2023-06-08 ENCOUNTER — Encounter: Payer: Self-pay | Admitting: Obstetrics and Gynecology

## 2023-06-08 VITALS — BP 136/86 | HR 90 | Ht 61.0 in | Wt 132.0 lb

## 2023-06-08 DIAGNOSIS — N952 Postmenopausal atrophic vaginitis: Secondary | ICD-10-CM

## 2023-06-08 DIAGNOSIS — Z1331 Encounter for screening for depression: Secondary | ICD-10-CM | POA: Diagnosis not present

## 2023-06-08 DIAGNOSIS — Z01419 Encounter for gynecological examination (general) (routine) without abnormal findings: Secondary | ICD-10-CM

## 2023-06-08 DIAGNOSIS — Z5181 Encounter for therapeutic drug level monitoring: Secondary | ICD-10-CM | POA: Diagnosis not present

## 2023-06-08 DIAGNOSIS — Z8739 Personal history of other diseases of the musculoskeletal system and connective tissue: Secondary | ICD-10-CM | POA: Diagnosis not present

## 2023-06-08 MED ORDER — ESTRADIOL 0.1 MG/GM VA CREA: TOPICAL_CREAM | VAGINAL | 1 refills | Status: AC

## 2023-06-08 NOTE — Patient Instructions (Signed)

## 2023-06-10 ENCOUNTER — Ambulatory Visit
Admission: RE | Admit: 2023-06-10 | Discharge: 2023-06-10 | Disposition: A | Discharge status: Home / Self Care | Payer: Medicare Other | Source: Ambulatory Visit | Attending: Obstetrics and Gynecology | Admitting: Obstetrics and Gynecology

## 2023-06-10 DIAGNOSIS — Z1231 Encounter for screening mammogram for malignant neoplasm of breast: Secondary | ICD-10-CM

## 2023-06-15 ENCOUNTER — Encounter: Payer: Self-pay | Admitting: Obstetrics and Gynecology

## 2023-06-24 ENCOUNTER — Ambulatory Visit: Payer: Medicare Other

## 2023-07-08 DIAGNOSIS — L820 Inflamed seborrheic keratosis: Secondary | ICD-10-CM | POA: Diagnosis not present

## 2023-07-08 DIAGNOSIS — L918 Other hypertrophic disorders of the skin: Secondary | ICD-10-CM | POA: Diagnosis not present

## 2023-07-08 DIAGNOSIS — D2271 Melanocytic nevi of right lower limb, including hip: Secondary | ICD-10-CM | POA: Diagnosis not present

## 2023-07-08 DIAGNOSIS — D225 Melanocytic nevi of trunk: Secondary | ICD-10-CM | POA: Diagnosis not present

## 2023-07-08 DIAGNOSIS — D1801 Hemangioma of skin and subcutaneous tissue: Secondary | ICD-10-CM | POA: Diagnosis not present

## 2023-07-08 DIAGNOSIS — L821 Other seborrheic keratosis: Secondary | ICD-10-CM | POA: Diagnosis not present

## 2023-07-08 DIAGNOSIS — L720 Epidermal cyst: Secondary | ICD-10-CM | POA: Diagnosis not present

## 2023-07-08 DIAGNOSIS — D2261 Melanocytic nevi of right upper limb, including shoulder: Secondary | ICD-10-CM | POA: Diagnosis not present

## 2023-07-14 ENCOUNTER — Encounter: Payer: Self-pay | Admitting: Internal Medicine

## 2023-07-14 ENCOUNTER — Other Ambulatory Visit: Payer: Self-pay

## 2023-07-14 DIAGNOSIS — E559 Vitamin D deficiency, unspecified: Secondary | ICD-10-CM

## 2023-07-14 DIAGNOSIS — Z79899 Other long term (current) drug therapy: Secondary | ICD-10-CM

## 2023-07-14 DIAGNOSIS — Z0000 Encounter for general adult medical examination without abnormal findings: Secondary | ICD-10-CM

## 2023-07-14 DIAGNOSIS — E785 Hyperlipidemia, unspecified: Secondary | ICD-10-CM

## 2023-07-23 DIAGNOSIS — E785 Hyperlipidemia, unspecified: Secondary | ICD-10-CM | POA: Diagnosis not present

## 2023-07-23 DIAGNOSIS — Z79899 Other long term (current) drug therapy: Secondary | ICD-10-CM | POA: Diagnosis not present

## 2023-07-23 DIAGNOSIS — E559 Vitamin D deficiency, unspecified: Secondary | ICD-10-CM | POA: Diagnosis not present

## 2023-07-23 DIAGNOSIS — Z0000 Encounter for general adult medical examination without abnormal findings: Secondary | ICD-10-CM | POA: Diagnosis not present

## 2023-07-24 LAB — NMR, LIPOPROFILE
Cholesterol, Total: 259 mg/dL — ABNORMAL HIGH (ref 100–199)
HDL Particle Number: 44.3 umol/L (ref >=30.5)
HDL-C: 95 mg/dL (ref >39)
LDL Particle Number: 1406 nmol/L — ABNORMAL HIGH (ref <1000)
LDL Size: 22.2 nm (ref >20.5)
LDL-C (NIH Calc): 148 mg/dL — ABNORMAL HIGH (ref 0–99)
Triglycerides: 94 mg/dL (ref 0–149)

## 2023-07-24 LAB — BASIC METABOLIC PANEL WITH GFR
BUN/Creatinine Ratio: 25 (ref 12–28)
BUN: 15 mg/dL (ref 8–27)
CO2: 24 mmol/L (ref 20–29)
Calcium: 9.5 mg/dL (ref 8.7–10.3)
Chloride: 103 mmol/L (ref 96–106)
Creatinine, Ser: 0.61 mg/dL (ref 0.57–1.00)
Glucose: 87 mg/dL (ref 70–99)
Potassium: 4.3 mmol/L (ref 3.5–5.2)
Sodium: 142 mmol/L (ref 134–144)
eGFR: 95 mL/min/{1.73_m2} (ref >59)

## 2023-07-24 LAB — CBC
Hematocrit: 38.6 % (ref 34.0–46.6)
Hemoglobin: 13.3 g/dL (ref 11.1–15.9)
MCH: 31.1 pg (ref 26.6–33.0)
MCHC: 34.5 g/dL (ref 31.5–35.7)
MCV: 90 fL (ref 79–97)
Platelets: 285 10*3/uL (ref 150–450)
RBC: 4.27 x10E6/uL (ref 3.77–5.28)
RDW: 13.1 % (ref 11.7–15.4)
WBC: 3.2 10*3/uL — ABNORMAL LOW (ref 3.4–10.8)

## 2023-07-24 LAB — VITAMIN D 25 HYDROXY (VIT D DEFICIENCY, FRACTURES): Vit D, 25-Hydroxy: 49.6 ng/mL (ref 30.0–100.0)

## 2023-07-24 LAB — APOLIPOPROTEIN B: Apolipoprotein B: 103 mg/dL — ABNORMAL HIGH (ref <90)

## 2023-07-28 ENCOUNTER — Encounter: Payer: Self-pay | Admitting: Internal Medicine

## 2023-08-03 DIAGNOSIS — M25511 Pain in right shoulder: Secondary | ICD-10-CM | POA: Diagnosis not present

## 2023-08-06 DIAGNOSIS — M25511 Pain in right shoulder: Secondary | ICD-10-CM | POA: Diagnosis not present

## 2023-08-17 DIAGNOSIS — M25511 Pain in right shoulder: Secondary | ICD-10-CM | POA: Diagnosis not present

## 2023-08-19 DIAGNOSIS — Z0000 Encounter for general adult medical examination without abnormal findings: Secondary | ICD-10-CM | POA: Diagnosis not present

## 2023-08-24 DIAGNOSIS — M25511 Pain in right shoulder: Secondary | ICD-10-CM | POA: Diagnosis not present

## 2023-08-27 DIAGNOSIS — M25511 Pain in right shoulder: Secondary | ICD-10-CM | POA: Diagnosis not present

## 2023-09-01 DIAGNOSIS — M25511 Pain in right shoulder: Secondary | ICD-10-CM | POA: Diagnosis not present

## 2023-09-04 DIAGNOSIS — M25511 Pain in right shoulder: Secondary | ICD-10-CM | POA: Diagnosis not present

## 2023-09-15 DIAGNOSIS — M25511 Pain in right shoulder: Secondary | ICD-10-CM | POA: Diagnosis not present

## 2023-09-24 ENCOUNTER — Other Ambulatory Visit (HOSPITAL_BASED_OUTPATIENT_CLINIC_OR_DEPARTMENT_OTHER): Payer: Self-pay | Admitting: Obstetrics and Gynecology

## 2023-09-24 ENCOUNTER — Encounter: Payer: Self-pay | Admitting: Obstetrics and Gynecology

## 2023-09-24 DIAGNOSIS — Z1231 Encounter for screening mammogram for malignant neoplasm of breast: Secondary | ICD-10-CM

## 2023-09-25 ENCOUNTER — Other Ambulatory Visit: Payer: Self-pay | Admitting: Obstetrics and Gynecology

## 2023-09-25 DIAGNOSIS — Z780 Asymptomatic menopausal state: Secondary | ICD-10-CM

## 2023-09-25 DIAGNOSIS — M25511 Pain in right shoulder: Secondary | ICD-10-CM | POA: Diagnosis not present

## 2023-09-25 DIAGNOSIS — Z8739 Personal history of other diseases of the musculoskeletal system and connective tissue: Secondary | ICD-10-CM

## 2023-09-25 NOTE — Progress Notes (Signed)
Order for bone density 

## 2023-09-29 ENCOUNTER — Other Ambulatory Visit: Payer: Self-pay | Admitting: Obstetrics and Gynecology

## 2023-09-29 DIAGNOSIS — Z780 Asymptomatic menopausal state: Secondary | ICD-10-CM

## 2023-09-29 NOTE — Progress Notes (Signed)
 Order for DEXA at [facility].

## 2023-10-15 DIAGNOSIS — M25511 Pain in right shoulder: Secondary | ICD-10-CM | POA: Diagnosis not present

## 2023-11-04 DIAGNOSIS — M25511 Pain in right shoulder: Secondary | ICD-10-CM | POA: Diagnosis not present

## 2023-12-09 DIAGNOSIS — H2513 Age-related nuclear cataract, bilateral: Secondary | ICD-10-CM | POA: Diagnosis not present

## 2023-12-09 DIAGNOSIS — D3122 Benign neoplasm of left retina: Secondary | ICD-10-CM | POA: Diagnosis not present

## 2024-01-18 DIAGNOSIS — Z23 Encounter for immunization: Secondary | ICD-10-CM | POA: Diagnosis not present

## 2024-01-21 DIAGNOSIS — Z23 Encounter for immunization: Secondary | ICD-10-CM | POA: Diagnosis not present

## 2024-01-29 ENCOUNTER — Encounter: Payer: Self-pay | Admitting: Internal Medicine

## 2024-01-29 DIAGNOSIS — Z79899 Other long term (current) drug therapy: Secondary | ICD-10-CM

## 2024-01-29 DIAGNOSIS — E785 Hyperlipidemia, unspecified: Secondary | ICD-10-CM

## 2024-02-01 NOTE — Telephone Encounter (Signed)
 Repeat NMR panel  CMET Hgb A1C

## 2024-02-16 DIAGNOSIS — M5431 Sciatica, right side: Secondary | ICD-10-CM | POA: Diagnosis not present

## 2024-02-16 DIAGNOSIS — M25561 Pain in right knee: Secondary | ICD-10-CM | POA: Diagnosis not present

## 2024-04-16 ENCOUNTER — Other Ambulatory Visit: Payer: Self-pay | Admitting: Internal Medicine

## 2024-04-22 LAB — NMR, LIPOPROFILE
Cholesterol, Total: 267 mg/dL — ABNORMAL HIGH (ref 100–199)
HDL Particle Number: 42 umol/L
HDL-C: 97 mg/dL
LDL Particle Number: 1540 nmol/L — ABNORMAL HIGH
LDL Size: 22.3 nm
LDL-C (NIH Calc): 153 mg/dL — ABNORMAL HIGH (ref 0–99)
LP-IR Score: <25
Small LDL Particle Number: <90 nmol/L
Triglycerides: 101 mg/dL (ref 0–149)

## 2024-04-22 LAB — COMPREHENSIVE METABOLIC PANEL WITH GFR
ALT: 30 IU/L (ref 0–32)
AST: 26 IU/L (ref 0–40)
Albumin: 4.3 g/dL (ref 3.8–4.8)
Alkaline Phosphatase: 72 IU/L (ref 49–135)
BUN/Creatinine Ratio: 22 (ref 12–28)
BUN: 13 mg/dL (ref 8–27)
Bilirubin Total: 0.5 mg/dL (ref 0.0–1.2)
CO2: 23 mmol/L (ref 20–29)
Calcium: 9.4 mg/dL (ref 8.7–10.3)
Chloride: 105 mmol/L (ref 96–106)
Creatinine, Ser: 0.59 mg/dL (ref 0.57–1.00)
Globulin, Total: 2.8 g/dL (ref 1.5–4.5)
Glucose: 85 mg/dL (ref 70–99)
Potassium: 4.1 mmol/L (ref 3.5–5.2)
Sodium: 144 mmol/L (ref 134–144)
Total Protein: 7.1 g/dL (ref 6.0–8.5)
eGFR: 96 mL/min/1.73

## 2024-04-25 ENCOUNTER — Ambulatory Visit: Payer: Self-pay | Admitting: Internal Medicine

## 2024-04-27 NOTE — Progress Notes (Signed)
 "   Cardiology Office Note   Date:  04/28/2024   ID:  Amber, Escobar [birth year], MRN [id number]  PCP:  Cindy Clotilda HERO, DO  Cardiologist:   Vina Gull, MD    Pt presents for f/u of palpitaiton and HL      History of Present Illness: Amber Escobar is a 74 y.o. female with a history of palpitations and profound dyslipidemia. I saw the pt in Dec 2022 2020  Ca score CT  Score 0   The pt had previously been on Pravachol    SHe stopped due to achiness   Has been on Zetia   I saw the pt in Jan 2025  Since seen she denies CP  No palpitations   Breathing is good   No dizziness BP at home 110s to 130s        Current Outpatient Medications  Medication Sig Dispense Refill   aspirin 81 MG tablet Take 81 mg by mouth daily.     calcium  carbonate (OSCAL) 1500 (600 Ca) MG TABS tablet Take by mouth daily.     Cholecalciferol (VITAMIN D ) 125 MCG (5000 UT) CAPS Take 5,000 Units by mouth daily in the afternoon. 100 capsule 3   clobetasol ointment (TEMOVATE) 0.05 % Apply 1 Application topically as needed.     Coenzyme Q10 (COQ-10) 100 MG CAPS      estradiol  (ESTRACE ) 0.1 MG/GM vaginal cream Use 1/2 g vaginally every night for the first 2 weeks, then use 1/2 g vaginally two times a week. 42.5 g 1   ezetimibe  (ZETIA ) 10 MG tablet TAKE 1 TABLET BY MOUTH EVERY DAY 90 tablet 0   Multiple Vitamin (MULTIVITAMIN) tablet Take 1 tablet by mouth daily.     Multiple Vitamins-Minerals (PRESERVISION AREDS 2) CAPS Take 1 tablet by mouth 2 (two) times daily.     nystatin -triamcinolone  (MYCOLOG II) cream Apply 1 application topically 2 (two) times daily. Apply to affected area BID for up to 7 days. 60 g 0   Omega-3 Fatty Acids (OMEGA 3 PO) Take 3,000 mg by mouth daily.     No current facility-administered medications for this visit.    Allergies:   Patient has no known allergies.   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 She has never used smokeless tobacco. She reports current alcohol use of about 7.0 - 14.0 standard drinks of alcohol per week. She reports that she does not use drugs.   Family History:  The patient's family history includes COPD in her mother; CVA in her maternal grandfather, maternal grandmother, and mother; Cirrhosis in her paternal grandfather; Congestive Heart Failure in her father; Healthy in her  brother; Hypertension in her mother; Rheum arthritis in her father and paternal grandmother; Stroke in her mother.    ROS:  Please see the history of present illness. All other systems are reviewed and  Negative to the above problem except as noted.    PHYSICAL EXAM: VS:  BP (!) 140/80 (BP Location: Left Arm, Patient Position: Sitting, Cuff Size: Normal)   Pulse 71   Ht 5' 1 (1.549 m)   Wt 135 lb (61.2 kg)   LMP 04/14/2002   BMI 25.51 kg/m   GEN: Well nourished and  in no acute distress HEENT: normal Neck: no JVD, no carotid bruit Cardiac: RRR; NO murmurs   Respiratory:  clear to auscultation   GI: soft, nontender, no hepatomegaly  Ext  No LE edema    EKG:  EKG is ordered today.   NSR  First degree AV block     Lipid Panel    Component Value Date/Time   CHOL 259 (H) 06/19/2020 1006   TRIG 90 06/19/2020 1006   HDL 86 06/19/2020 1006   CHOLHDL 3.0 06/19/2020 1006   LDLCALC 158 (H) 06/19/2020 1006      Wt  Readings from Last 3 Encounters:  04/28/24 135 lb (61.2 kg)  06/08/23 132 lb (59.9 kg)  05/05/23 132 lb 9.6 oz (60.1 kg)    CT Calcium  Score    04/05/19 Calcium  score 0   Very mild atheroscleroiss of aorta   ASSESSMENT AND PLAN:  1  HL  LDL in 2021 was 207  She was started on Crestor  Moved to pravastatin     DId not tolerate due to achiness Lpa in 2022 was 57   Last lipids by PCP  in Jan 2026 LDL was 153  HDL 97  Trig 898 Will check Apo B Keep on Zetia  for now   2 Hx palpitatons   Pt denies     3  Blood pressure   BP OK at home  It is a little high here   I have encouraged her to follow more at home   Also to bring cuff in to next clinic visit to make sure accurate   4  MEtabolics   Will check A1C    Follow up in 1 year in clinic   Signed, Vina Gull, MD  04/28/2024 11:59 AM    [facility] [address] [place], [place], [place]  [address] Phone: [phone]; Fax: [phone]    "

## 2024-04-28 ENCOUNTER — Ambulatory Visit: Attending: Internal Medicine | Admitting: Internal Medicine

## 2024-04-28 VITALS — BP 140/80 | HR 71 | Ht 61.0 in | Wt 135.0 lb

## 2024-04-28 DIAGNOSIS — Z79899 Other long term (current) drug therapy: Secondary | ICD-10-CM | POA: Insufficient documentation

## 2024-04-28 DIAGNOSIS — E785 Hyperlipidemia, unspecified: Secondary | ICD-10-CM | POA: Insufficient documentation

## 2024-04-28 NOTE — Patient Instructions (Signed)
 Medication Instructions:  Your physician recommends that you continue on your current medications as directed. Please refer to the Current Medication list given to you today.  *If you need a refill on your cardiac medications before your next appointment, please call your pharmacy*  Lab Work: Please complete an A1c and a Apo B in our first floor lab before you leave today.  If you have labs (blood work) drawn today and your tests are completely normal, you will receive your results only by: MyChart Message (if you have MyChart) OR A paper copy in the mail If you have any lab test that is abnormal or we need to change your treatment, we will call you to review the results.  Testing/Procedures: None.  Follow-Up: At [facility], you and your health needs are our priority.  As part of our continuing mission to provide you with exceptional heart care, our providers are all part of one team.  This team includes your primary Cardiologist (physician) and Advanced Practice Providers or APPs (Physician Assistants and Nurse Practitioners) who all work together to provide you with the care you need, when you need it.  Your next appointment:   1 year(s)  Provider:   Vina Gull, MD

## 2024-04-29 LAB — APOLIPOPROTEIN B: Apolipoprotein B: 113 mg/dL — ABNORMAL HIGH

## 2024-04-29 LAB — HEMOGLOBIN A1C
Est. average glucose Bld gHb Est-mCnc: 114 mg/dL
Hgb A1c MFr Bld: 5.6 % (ref 4.8–5.6)

## 2024-05-02 ENCOUNTER — Ambulatory Visit: Payer: Self-pay | Admitting: Internal Medicine

## 2024-05-02 DIAGNOSIS — E785 Hyperlipidemia, unspecified: Secondary | ICD-10-CM

## 2024-05-02 DIAGNOSIS — Z79899 Other long term (current) drug therapy: Secondary | ICD-10-CM

## 2024-05-20 NOTE — Patient Instructions (Incomplete)

## 2024-05-23 ENCOUNTER — Ambulatory Visit: Admitting: Sports Medicine

## 2024-06-09 ENCOUNTER — Ambulatory Visit: Payer: Medicare Other | Admitting: Obstetrics and Gynecology

## 2024-06-13 ENCOUNTER — Ambulatory Visit (HOSPITAL_BASED_OUTPATIENT_CLINIC_OR_DEPARTMENT_OTHER): Admitting: Radiology

## 2024-06-20 ENCOUNTER — Ambulatory Visit: Admitting: Obstetrics and Gynecology

## 2024-08-09 ENCOUNTER — Ambulatory Visit (HOSPITAL_BASED_OUTPATIENT_CLINIC_OR_DEPARTMENT_OTHER)
